# Patient Record
Sex: Male | Born: 1980 | Race: White | Hispanic: No | Marital: Married
Health system: Midwestern US, Academic
[De-identification: ages and names within clinical notes are randomized; demographics above are authoritative.]

---

## 2016-11-26 ENCOUNTER — Encounter: Admit: 2016-11-26 | Discharge: 2016-11-27

## 2016-11-26 DIAGNOSIS — R69 Illness, unspecified: Principal | ICD-10-CM

## 2016-11-27 ENCOUNTER — Encounter: Admit: 2016-11-27 | Discharge: 2016-11-27

## 2016-11-27 ENCOUNTER — Encounter: Admit: 2016-11-27 | Discharge: 2016-11-28

## 2016-11-27 DIAGNOSIS — R69 Illness, unspecified: Principal | ICD-10-CM

## 2016-11-27 NOTE — Progress Notes
36 [place] get [place] liver transplant list.  Admitted 11/22 with AMS became obtunded and intubated.  Ammonia 430 yesterday down to 79 after lactalose.  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  100cc urine in past 20hours.  Some ascites. Does get frequent paracentesis per wife.    Patient is sedated and beginning to overbreathing the vent.

## 2016-11-29 ENCOUNTER — Encounter: Admit: 2016-11-29 | Discharge: 2016-11-29

## 2016-11-30 ENCOUNTER — Encounter: Admit: 2016-11-30 | Discharge: 2016-11-30

## 2016-11-30 DIAGNOSIS — R69 Illness, unspecified: Principal | ICD-10-CM

## 2016-12-09 ENCOUNTER — Encounter: Admit: 2016-12-09 | Discharge: 2016-12-09

## 2016-12-09 NOTE — Telephone Encounter
Received new referral, via fax. Docs scanned in 12/09/16. If patient is unable to answer, call Audery Amelhelsie Farrar, patient's social worker SW.

## 2016-12-11 ENCOUNTER — Encounter: Admit: 2016-12-11 | Discharge: 2016-12-11

## 2016-12-14 ENCOUNTER — Encounter: Admit: 2016-12-14 | Discharge: 2016-12-14

## 2016-12-14 NOTE — Telephone Encounter
Unable to locate active insurance cannot give financial clearance for transplant services at this time. Referral has been denied due to patient not having active insurance.

## 2016-12-14 NOTE — Telephone Encounter
Spoke with pt's wife, Lurena JoinerRebecca.  She states [facility] is [facility] and she expects to hear of approval this week.  She would like to go ahead and schedule new consult appt and understands that she may be required to pay $151 up front at the time of visit if there is no active coverage at the time pt is seen.

## 2016-12-18 ENCOUNTER — Encounter: Admit: 2016-12-18 | Discharge: 2016-12-18

## 2016-12-18 NOTE — Telephone Encounter
Called patient to schedule new appointment with Dr. Constance Goltzlson on Tuesday, January 12, 2017 at 11:20am.   Verified demos and mailed appointment letter/map.   Pt stated they are currently being approved for Medicaid. They have discussed already with financial.

## 2016-12-22 ENCOUNTER — Encounter: Admit: 2016-12-22 | Discharge: 2016-12-22

## 2016-12-30 ENCOUNTER — Encounter: Admit: 2016-12-30 | Discharge: 2016-12-30

## 2016-12-30 ENCOUNTER — Inpatient Hospital Stay
Admit: 2016-12-30 | Discharge: 2017-01-01 | Disposition: A | Discharge status: Home / Self Care | Payer: MEDICAID | Source: Other Acute Inpatient Hospital

## 2016-12-30 DIAGNOSIS — D649 Anemia, unspecified: ICD-10-CM

## 2016-12-30 DIAGNOSIS — K7290 Hepatic failure, unspecified without coma: ICD-10-CM

## 2016-12-30 DIAGNOSIS — D696 Thrombocytopenia, unspecified: ICD-10-CM

## 2016-12-30 DIAGNOSIS — E872 Acidosis: ICD-10-CM

## 2016-12-30 DIAGNOSIS — N179 Acute kidney failure, unspecified: Secondary | ICD-10-CM

## 2016-12-30 DIAGNOSIS — K7460 Unspecified cirrhosis of liver: Principal | ICD-10-CM

## 2016-12-30 DIAGNOSIS — I8510 Secondary esophageal varices without bleeding: ICD-10-CM

## 2016-12-30 DIAGNOSIS — R69 Illness, unspecified: Principal | ICD-10-CM

## 2016-12-30 LAB — COMPREHENSIVE METABOLIC PANEL
Lab: 11 U/L (ref 7–56)
Lab: 138 MMOL/L — ABNORMAL LOW (ref 137–147)
Lab: 16 MMOL/L — ABNORMAL LOW (ref 21–30)
Lab: 17 K/UL — ABNORMAL HIGH (ref 3–12)
Lab: 192 mg/dL — ABNORMAL HIGH (ref 70–100)
Lab: 2.7 mg/dL — ABNORMAL HIGH (ref 0.4–1.24)
Lab: 26 mL/min — ABNORMAL LOW (ref >60)
Lab: 32 mL/min — ABNORMAL LOW (ref >60)
Lab: 35 U/L (ref 7–40)
Lab: 4.1 mg/dL — ABNORMAL HIGH (ref 0.3–1.2)
Lab: 4.3 g/dL (ref 3.5–5.0)
Lab: 49 U/L — ABNORMAL LOW (ref 25–110)
Lab: 5.5 g/dL — ABNORMAL LOW (ref 6.0–8.0)
Lab: 54 mg/dL — ABNORMAL HIGH (ref 7–25)
Lab: 9.3 mg/dL — ABNORMAL HIGH (ref 8.5–10.6)

## 2016-12-30 LAB — CBC AND DIFF
Lab: 0.1 10*3/uL (ref 0–0.20)
Lab: 0.5 10*3/uL — ABNORMAL HIGH (ref 0–0.45)
Lab: 1 % (ref 0–2)
Lab: 10 10*3/uL (ref 4.5–11.0)
Lab: 10 FL (ref 7–11)
Lab: 133 10*3/uL — ABNORMAL LOW (ref 150–400)
Lab: 2.5 10*3/uL (ref 1.0–4.8)
Lab: 20 % — ABNORMAL HIGH (ref 11–15)
Lab: 25 % (ref 24–44)
Lab: 3.2 M/UL — ABNORMAL LOW (ref 4.4–5.5)
Lab: 30 pg (ref 26–34)
Lab: 34 g/dL (ref 32.0–36.0)
Lab: 5 % (ref 0–5)
Lab: 6.2 10*3/uL (ref 1.8–7.0)
Lab: 61 % (ref 41–77)
Lab: 8 % (ref 4–12)
Lab: 90 FL (ref 80–100)
Lab: 0.2 10*3/uL (ref 0–0.20)
Lab: 0.4 10*3/uL (ref 0–0.45)
Lab: 9 10*3/uL (ref 4.5–11.0)

## 2016-12-30 LAB — PERITONEAL FLUID ALBUMIN

## 2016-12-30 LAB — LACTIC ACID(LACTATE)
Lab: 5.1 MMOL/L — ABNORMAL HIGH (ref 0.5–2.0)
Lab: 3.6 MMOL/L — ABNORMAL HIGH (ref 0.5–2.0)

## 2016-12-30 LAB — URINALYSIS DIPSTICK
Lab: NEGATIVE
Lab: NEGATIVE
Lab: NEGATIVE
Lab: NEGATIVE
Lab: NEGATIVE
Lab: NEGATIVE
Lab: NEGATIVE

## 2016-12-30 LAB — HEPATITIS PANEL, ACUTE
Lab: NEGATIVE
Lab: NEGATIVE

## 2016-12-30 LAB — SODIUM-URINE RANDOM: Lab: 16 MMOL/L (ref 0–3)

## 2016-12-30 LAB — PERITONEAL FLUID TOTAL PROTEIN: Lab: 1.6 g/dL

## 2016-12-30 LAB — ALCOHOL LEVEL: Lab: <10 mg/dL

## 2016-12-30 LAB — PROTIME INR (PT)
Lab: 1.8 g/dL — ABNORMAL HIGH (ref 0.8–1.2)
Lab: 1.9 — ABNORMAL HIGH (ref 0.8–1.2)
Lab: 2.2 — ABNORMAL HIGH (ref 0.8–1.2)

## 2016-12-30 LAB — CREATININE-URINE RANDOM: Lab: 58 mg/dL

## 2016-12-30 LAB — URINALYSIS, MICROSCOPIC

## 2016-12-30 LAB — ACETAMINOPHEN LEVEL: Lab: <10 ug/mL (ref <20.1)

## 2016-12-30 LAB — MAGNESIUM
Lab: 1.7 mg/dL — ABNORMAL LOW (ref 1.6–2.6)
Lab: 2 mg/dL — ABNORMAL LOW (ref 1.6–2.6)

## 2016-12-30 LAB — PHOSPHORUS: Lab: 4.1 mg/dL — ABNORMAL LOW (ref 2.0–4.5)

## 2016-12-30 LAB — PTT (APTT): Lab: 35 s — ABNORMAL LOW (ref 24.0–36.5)

## 2016-12-30 LAB — BASIC METABOLIC PANEL: Lab: 142 MMOL/L (ref 137–147)

## 2016-12-30 LAB — UREA NITROGEN-URINE RANDOM: Lab: 448 mg/dL (ref 5.0–8.0)

## 2016-12-30 LAB — HIV 1& 2 AG-AB SCRN W REFLEX HIV 1 PCR QUANT: Lab: NEGATIVE

## 2016-12-30 LAB — POC GLUCOSE: Lab: 156 mg/dL — ABNORMAL HIGH (ref 70–100)

## 2016-12-30 MED ORDER — LACTULOSE 10 GRAM/15 ML PO SOLN
30 mL | Freq: Three times a day (TID) | ORAL | 0 refills | Status: DC
Start: 2016-12-30 — End: 2016-12-30
  Administered 2016-12-30: 15:00:00 20 g via ORAL

## 2016-12-30 MED ORDER — HEPARIN, PORCINE (PF) 5,000 UNIT/0.5 ML IJ SYRG
5000 [IU] | SUBCUTANEOUS | 0 refills | Status: DC
Start: 2016-12-30 — End: 2017-01-02
  Administered 2016-12-30 – 2017-01-01 (×6): 5000 [IU] via SUBCUTANEOUS

## 2016-12-30 MED ORDER — ALBUMIN, HUMAN 25 % IV SOLP
0 refills | Status: CP
Start: 2016-12-30 — End: ?
  Administered 2016-12-30 (×4): 12.5 g via INTRAVENOUS

## 2016-12-30 MED ORDER — POTASSIUM CHLORIDE 20 MEQ/15 ML PO LIQD
60 meq | Freq: Once | ORAL | 0 refills | Status: CP
Start: 2016-12-30 — End: ?
  Administered 2016-12-31: 60 meq via ORAL

## 2016-12-30 MED ORDER — POTASSIUM CHLORIDE IN WATER 10 MEQ/50 ML IV PGBK
10 meq | INTRAVENOUS | 0 refills | Status: CP
Start: 2016-12-30 — End: ?
  Administered 2016-12-30 (×4): 10 meq via INTRAVENOUS

## 2016-12-30 MED ORDER — MAGNESIUM SULFATE IN D5W 1 GRAM/100 ML IV PGBK
1 g | INTRAVENOUS | 0 refills | Status: CP
Start: 2016-12-30 — End: ?
  Administered 2016-12-30 (×2): 1 g via INTRAVENOUS

## 2016-12-30 MED ORDER — CEFTRIAXONE INJ 1GM IVP
1 g | INTRAVENOUS | 0 refills | Status: DC
Start: 2016-12-30 — End: 2016-12-31
  Administered 2016-12-30: 23:00:00 1 g via INTRAVENOUS

## 2016-12-30 MED ORDER — POTASSIUM CHLORIDE 20 MEQ PO TBTQ
40-60 meq | ORAL | 0 refills | Status: DC | PRN
Start: 2016-12-30 — End: 2017-01-02
  Administered 2017-01-01: 18:00:00 40 meq via ORAL

## 2016-12-30 MED ORDER — ALBUMIN, HUMAN 25 % IV SOLP
50 g | Freq: Once | INTRAVENOUS | 0 refills | Status: CP
Start: 2016-12-30 — End: ?
  Administered 2016-12-30: 15:00:00 50 g via INTRAVENOUS

## 2016-12-30 MED ORDER — SODIUM CHLORIDE 0.9 % IV SOLP
1000 mL | Freq: Once | INTRAVENOUS | 0 refills | Status: CP
Start: 2016-12-30 — End: ?
  Administered 2016-12-30: 17:00:00 1000 mL via INTRAVENOUS

## 2016-12-30 MED ORDER — POTASSIUM CHLORIDE 20 MEQ/15 ML PO LIQD
60 meq | Freq: Once | ORAL | 0 refills | Status: CP
Start: 2016-12-30 — End: ?
  Administered 2016-12-30: 16:00:00 60 meq via ORAL

## 2016-12-30 MED ORDER — OCTREOTIDE ACETATE 200 MCG/ML IJ SOLN
50 ug | SUBCUTANEOUS | 0 refills | Status: DC
Start: 2016-12-30 — End: 2016-12-30

## 2016-12-30 MED ORDER — ZINC SULFATE 220 (50) MG PO CAP
220 mg | Freq: Every day | ORAL | 0 refills | Status: DC
Start: 2016-12-30 — End: 2017-01-02
  Administered 2016-12-30 – 2017-01-01 (×3): 220 mg via ORAL

## 2016-12-30 MED ORDER — ALBUMIN, HUMAN 5 % IV SOLP
500 mL | Freq: Once | INTRAVENOUS | 0 refills | Status: CP
Start: 2016-12-30 — End: ?
  Administered 2016-12-31: 06:00:00 500 mL via INTRAVENOUS

## 2016-12-30 MED ORDER — ONDANSETRON HCL (PF) 4 MG/2 ML IJ SOLN
4 mg | INTRAVENOUS | 0 refills | Status: DC | PRN
Start: 2016-12-30 — End: 2017-01-02
  Administered 2017-01-01: 18:00:00 4 mg via INTRAVENOUS

## 2016-12-30 MED ORDER — MIDODRINE 5 MG PO TAB
5 mg | Freq: Three times a day (TID) | ORAL | 0 refills | Status: DC
Start: 2016-12-30 — End: 2016-12-30

## 2016-12-30 MED ORDER — MAGNESIUM HYDROXIDE 2,400 MG/10 ML PO SUSP
10 mL | ORAL | 0 refills | Status: DC | PRN
Start: 2016-12-30 — End: 2017-01-02

## 2016-12-30 MED ORDER — POTASSIUM CHLORIDE 20 MEQ/15 ML PO LIQD
40-60 meq | NASOGASTRIC | 0 refills | Status: DC | PRN
Start: 2016-12-30 — End: 2017-01-02

## 2016-12-30 MED ORDER — ACETAMINOPHEN 325 MG PO TAB
325 mg | ORAL | 0 refills | Status: DC | PRN
Start: 2016-12-30 — End: 2017-01-02

## 2016-12-30 MED ORDER — LACTULOSE 10 GRAM/15 ML PO SOLN
30 mL | ORAL | 0 refills | Status: DC
Start: 2016-12-30 — End: 2016-12-31
  Administered 2016-12-30 – 2016-12-31 (×10): 20 g via ORAL

## 2016-12-30 MED ORDER — RIFAXIMIN 550 MG PO TAB
550 mg | Freq: Two times a day (BID) | ORAL | 0 refills | Status: DC
Start: 2016-12-30 — End: 2017-01-02
  Administered 2016-12-30 – 2017-01-01 (×5): 550 mg via ORAL

## 2016-12-30 MED ORDER — FUROSEMIDE 40 MG PO TAB
40 mg | Freq: Every day | ORAL | 0 refills | Status: DC
Start: 2016-12-30 — End: 2016-12-30

## 2016-12-30 MED ORDER — MELATONIN 3 MG PO TAB
3 mg | Freq: Every evening | ORAL | 0 refills | Status: DC | PRN
Start: 2016-12-30 — End: 2016-12-30

## 2016-12-30 MED ORDER — INSULIN ASPART 100 UNIT/ML SC FLEXPEN
0-7 [IU] | Freq: Every day | SUBCUTANEOUS | 0 refills | Status: DC
Start: 2016-12-30 — End: 2016-12-31
  Administered 2016-12-30: 22:00:00 1 [IU] via SUBCUTANEOUS

## 2016-12-30 MED ORDER — OCTREOTIDE ACETATE 200 MCG/ML IJ SOLN
100 ug | Freq: Three times a day (TID) | SUBCUTANEOUS | 0 refills | Status: DC
Start: 2016-12-30 — End: 2017-01-02
  Administered 2016-12-30 – 2017-01-01 (×7): 100 ug via SUBCUTANEOUS

## 2016-12-30 MED ORDER — MAGNESIUM SULFATE IN D5W 1 GRAM/100 ML IV PGBK
1 g | INTRAVENOUS | 0 refills | Status: DC | PRN
Start: 2016-12-30 — End: 2017-01-02
  Administered 2017-01-01 (×2): 1 g via INTRAVENOUS

## 2016-12-30 NOTE — Case Management (ED)
Case Management Progress Note    NAME:Tristan Briggs                          MRN: [id number]              DOB:[birth year]          AGE: 36 y.o.  ADMISSION DATE: 12/30/2016             DAYS ADMITTED: LOS: 0 days      Todays Date: 12/30/2016    Plan  Discharge planning ongoing.    Interventions  ? Support      ? Info or Referral      ? Discharge Planning      ? Medication Needs      ? Financial    Per Jolene SchimkeAnna Steinnagel, patient financial advisor:  "I spoke with PTs wife Lurena JoinerRebecca (she was extremely helpful). I was able to get them screened. I also went up and got the ROI signed by the PT, wife provided me SSDI approval letter as well. Im faxing [facility] as we speak and will also forward you the forms as well. PTs wife believes it was about 5-7 days ago when [facility] were at [facility] [place] that they need to supply bank statements for application, and that was done per PTs wife. I will have to give the Medicaid office roughly 24 hours from the time I fax the form for them to receive it. I will follow up later on tomorrow and see if I can get any update on application status."      ? Legal      ? Other        Disposition  ? Expected Discharge Date    Expected Discharge Date: 01/02/17  ? Transportation   Does the patient need discharge transport arranged?: No  Transportation Name, Phone and Availability #1: [phone]  Does the patient use Medicaid Transportation?: No  ? Next Level of Care (Acute Psych discharges only)      ? Discharge Disposition                                          Durable Medical Equipment      No service has been selected for the patient.      [facility]      No service has been selected for the patient.      [facility]      No service has been selected for the patient.      [facility]      No service has been selected for the patient.          Cleophas Dunkeranielle Candiss Galeana, RN, MSN  Nurse Case Manager  [phone]

## 2016-12-30 NOTE — Progress Notes
Paracentesis   1. Vital signs q 15 minutes x 2 then may resume prior order.  2. Bed rest with bathroom privileges x 1 hour then advance activity as tolerated.  3. Change dressing if it becomes wet or soiled. Remove 24 hours after procedure.  4. Implement Albumin protocol.   Determine the volume of fluid removed from the patient   1. ?5 Liters of ascetic fluid - normal renal function  hemodynamically stable vital signs  No albumin resuscitation  2. < 5 Liters of ascetic fluid - renal insufficiency (SCr > 1/3 mg/dl or a GFR < 60ml/min)   1. < 1.5L No albumin replacement  2. < 1.6-2.0 12.5gm (50ml) Albumin 25%  3. < 2.1-3.9L 25gm (100ml) Albumin 25%  4. < 4.0-5.0L 37.5gm (150ml) Albumin 25%  3. > 5 Liters of ascetic fluid  37.5gm (150ml) of Albumin 25% then 50ml additional  Albumin 25% for every 2.0L of fluid removed

## 2016-12-30 NOTE — Progress Notes
SOCIAL WORK PSYCHOSOCIAL ASSESSMENT      Name:  Tristan Briggs #: [id number]     Date: 12/30/2016     Referral Source: Dr. Constance Goltz                                             Date Referred:  12/30/16  Referral Reason:OLT Psychosocial Assessment  Source of Information: Pt/s wife, Lurena Joiner, and pt         Date Interviewed:  12/30/16&12/31/16  Patient's Address:  [address]  [place] [address]                                     Telephone #:  [phone] (home)                                                                                1.  PRESENT SITUATION:                Sex: male          Age: 36 y.o.          Birthdate: [birth year]  Primary Language: Lenox Ponds   Religous Preference:Christian    Diagnosis:   ESLD, AKI  Statement of Presenting Problem(s):  Ps is a 36 yr old male who was transferred from OSH for management of his ESLD and AKI. Pt states he was diagnosed with liver disease a little while after he quit drinking, 4/18. Pt states he was having symptoms of liver disease before he got sick. 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 He states he quit drinking, 04/11/16 because he knew he needed to quit. Lurena Joiner states that pt wanted to quit drinking in March 2018 and went to the ED to detox. She states he was given medication to help detox at home and was discharged.  She states pt had vomiting and nausea, trouble sleeping and was having bad hallucinations. Pt went back to drinking but not as much.  She states pt went back to the ED in April and was given more medication to detox at home and then discharged.  She states pt had some of the same issues but not as bad and has remained sober since.  Pt states he was never really sick in the past. He states he was a little overweight.  He did not have a PCP and did not go to the doctor unless he was sick.  Pt denies taking OTC medication. He states he would not even take Asprin for pain.  Pt's wife has been keeping track of pt's appointments and making sure pt attends as scheduled.  Pt states he stopped driving, 5/62, because he was too scared to drive with his medical condition. Pt states he has had multiple ED visits and hospitalizations since diagnosis. He states he would go the ED for paracentesis in [place].  Lurena Joiner states hospitalizations were for nausea, vomiting, HE, and paracentesis.  He [facility] ED at  [facility] [address] in [place].  Pt saw a GI doctor at [facility] who told pt to come back in six months.  Lurena Joiner lost her job caring for pt and then pt got on MediCal. He was then admitted to [facility]. Lurena Joiner researched transplant programs and came to [facility]. Pt has been hospitalized 3 times at [facility] in [place] since arriving, 11/23/16.        2.  PATIENT'S LIVING SITUATION    Comments:  Pt was born and raised in [place].  Pt, his wife, and two children were living with his father in law, in a home that he owned.  Pt moved to [place], 11/23/16.  Pt's wife came to [place], 10/18.  She states she did her research on transplant and decided to come to [place].  She states she has a friend who lives in [place], [place] (10-15 minutes from [place], [place]).  She states after driving to [place] she drove to [place], [place] to look at [facility] program.  She states she did not like it there and came back to [facility]. Pt states all of his stuff is in a storage unit in [place].  She states they, pt, Lurena Joiner, and two children, have been staying with her friend until, 12/24/16.  She states that is when the moved into the duplex they are renting in [place], [place]. There are about 8 stairs to enter the home. There is only one bathroom in the home and it is on the upper level. Pt has been staying on the main level of the home.  Pt has one dog for a pet. Pt had HH for IV Antibiotics when he was living in [place].  Pt has not stayed in a SNF, LTACH or IRF in the pat.  Pt is responsible for his own medication management. Lurena Joiner states pt has alarms on his phone set to remind him to take medications. Pt confirms that he has reminders set on his phone and states his wife helps with medication management when she is not working.  Pt states he used to miss medications but realized that his ammonia would get too high after missing just one dose of lactulose and denies missing medications currently.   Pt states he is starting over in [place] and has no plans to move back to [place]. He states Rebecca's oldest son, is planning to move to  to help pt.  Pt states his sister is also considering moving to .       3.  FINANCIAL RESOURCES    Comments:  Pt was unable to report when he last worked. He states he isn't good with dates.  Lurena Joiner reported pt last worked, 11/17.  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  He states he worked there for a year and a half. He states prior to that he worked at [employer] doing lost prevention.  He states he worked at [employer] for about a year.  Pt states he stopped working because he was getting sick all of the time. He states alcohol was also a factor in keeping a job. He states he was drinking before coming to work and drinking right after work.  Lurena Joiner states they applied for SS disability, 6/18 and was recently approved.  She states pt should receive his first full check next month.  She states he will be receiving $723/mo gross. She states pt has child support and taxes taken  out of his check.   Lurena Joiner transferred her job in [place] to a company in [place].  She has only been working for a couple of weeks.  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  She is unsure what her schedule will be like once off training. Lurena Joiner states she makes $15/hr and usually only works 40 hours/wk.  She states she receives $28/mo in Child support for her two older children.  She denies there is any other income in the home. Pt does not currently have insurance.  Lurena Joiner states pt had insurance through [place] but she lost her job because she was providing care for pt.  Pt then had MediCal.  Lurena Joiner states they applied for [place] Medicaid through the [facility], 11/18.  She states someone at [facility] is trying to get the application expedited.  She states Tobi Bastos, Artist, [employer], is working on finding out status of application.  Pt has been getting medication through [facility] in [place].  Lurena Joiner states [address] Thelma Barge has been giving them vouchers for medication.  She also reports pt brought some medication from [place].  Lurena Joiner states she receives $468/mo in [facility] assistance.  She states this will decrease because she is working now.  Pt had not met with TFA at time of SW assessment.  Pt states when he and his wife are not working they have financial difficulties. He states that he lets his wife deal with all of the finances.      4.  SUPPORTIVE RELATIONSHIPS/ COMMUNITY RESOURCES     Comments:  Pt has been in a relationship with Lurena Joiner for 17 years. Pt states they have been legally married for one year.  Lurena Joiner states they have been married for 5 years.  Pt has 3 biological children (youngest two are with Lurena Joiner) and two step children that he considers his own; Harrold Donath, 18, never had contact, Virgil, 14 and Baldwin, 11.  Lurena Joiner has 4 children; Oglesby, 21, and Ty[place]in, [place].  She states all of the older children live in [place].  Pt states Erie Noe is pregnant and due, August 2019.  Pt's mother is living and in good health. She lives in [place] with pt's step dad, [employer].  Pt states his mother provides care for his half sister who is autistic.  Pt states his mother will not be part of his post transplant support.  Pt's father is deceased. Pt states his father died when he was young of a heroin overdose.  Pt states he doesn't like talking about it.  Pt has a half brother and two half sisters. Pt's brother is in [employer] and lives in [place].  Pt states his brother plans to come to visit pt, 01/10/17.  Pt reports good support from his sister, [place].  Monica lives in [place] and pt states she will take time off work and come to [employer] and assist with pt's care.  Lurena Joiner states she will be pt's primary caregiver.  She states she will provide care post transplant.  She states if she is unable to provide care, her friend, Annice Pih, will provide care.  She states Annice Pih has been assisting with care currently.  Annice Pih is currently not working.  Lurena Joiner will provide transportation to the hospital once pt receives transplant call.       Caregiver Support  1.Rebecca, spouse, [phone]  2.Clarisse Gouge, friend, [phone] (not working)  3. Flint Melter, sister, [phone]    5. COGNITIVE ABILITIES    Comments:  Pt was alert and oriented during assessment. Pt has a general understanding of his medical condition and of the transplant process.  SW discussed risks and benefits of transplant. Pt denies having concerns regarding receiving a transplant.  Pt states he has too much to live for. Pt hopes he will be able to go back to work post transplant. He states he hopes to have a sober life with his children.  Pt was informed of the OLT Support group and [facility] Program.    Pt is aware he will be required to take life long medication post transplant. Pt is aware of the frequent f/u at [facility] post transplant. Pt is aware he will be required to have 24 hr care (for at least two weeks) post transplant discharge.  SW will complete Patient Responsibilities Form with pt and spouse once pt is evaluated for transplant.      Pt states he used to occasionally smoke in the past.  He states he would smoke a pack a week. Pt states he last smoking over a year ago.  Pt states he just decided to quit. Pt denies using chewing tobacco.  Pt is aware he cannot use tobacco in any form.      Pt states because of his dad he never really did drugs and was too scared. He states th has used marijuana occasionally. He states he last used marijuana before he came to [place], 11/18. He states prior to that it was around Halloween. Pt states he has used CBD oil but last used prior to his wife leaving to come to , 10/18.  Pt denies attending treatment for drug use in the past. Pt is aware he cannot use illicit drugs in any form.      Pt states he started drinking when he was young, around age [age]. Pt states he drank beer at that time. He states he got in to partying early. He states by the age 26 he would drink at home. He states he would drink 6-12 beers per night at that time. Pt states over time the amount he drank increased. He states his drink of choice was Whiskey.  He states at the heaviest he would drink a fifth a day plus beer or limeritas.  Pt states he would drink 7 days a week. Pt states he would have to drink to feel normal, function. Pt states he would search the house for money to buy alcohol and would get whatever he could afford.  He states he last drank, 04/11/16. He states he quit because he was sick of the constant vomiting and no sleep for days. Pt denies ever having a DUI, or current legal issues. Pt states he was on probation when he was [age] for an alcohol related incident. He states he states he was charged with trespassing and attempted grand theft.  Pt denies ever attending treatment for alcohol. Pt states he attended one AA meeting in [place]. Pt states the doctor that gave him paracentesis suggested pt attend. Pt states he would argue with his wife about alcohol every once in awhile but he states she would often be drinking with him.  Pt states she quit drinking when he did, 04/11/16.  Pt states his mother in law died of alcoholism. He states his sister used to drink and stopped  when he was living with her because of what pt is going through.  Pt states he has no desire to drink.  Sw discussed importance in enrolling in treatment and recommended pt contact [facility].  SW provided counseling checklist and list of local counseling resources, including [facility]'s contact information.      Pt denies history of SI/SA, psychiatric hospitalizations or MH diagnosis.  Pt states he did have AH,VH, TH after he quit drinking. He denies having any form of hallucinations currently.      Pt completed 11th grade. He states he has plans to get his GED. Pt denies ever being in the [employer]. Pt enjoys writing, drawing, art, building models, horror movies, hiking and camping.  Pt does not have an AD/DPOA-HC and was educated on the importance of this document.      6.  SOCIAL WORK EVALUATION    Comments:  SW met with pt and his wife to complete OLT Psychosocial Assessment. Pt receives SS disability and is currently uninsured. He has applied for [facility] and is awaiting a decision. Pt's wife is his primary support and she states she will provide 24 hr care for pt post transplant. Rebecca's friend and pt's sister will assist with care and transportation if needed. Pt will likely benefit from ongoing education on transplant.  Pt denies recent tobacco use. Pt last use marijuana 11/18. Pt last drank, 4/18.  SW recommends pt engage in treatment on discharge.  Pt's case discussed with Hepatology Team.      7.  PLAN    Plan Discussed with Patient / Family:  {PLAN DISCUSSED, AGREED UPON  Plan Agreed Upon with Patient / Family:  {PLAN DISCUSSED, AGREED UPON    Nicanor Bake, LMSW

## 2016-12-30 NOTE — Discharge Instructions - Appointments
Please contact the clinic and schedule a follow up appointment.    [facility]   5.0 (5)  Medical clinic   [address]   [phone]       Visitor Information  What to Bring  Photo ID   Proof of Residency: A bill in your name sent to your current address, a bank statement in your name sent to your current address   Proof of income (if uninsured and interested in sliding-scale fee) OR insurance card   If uninsured, please bring one of these: most recent tax return or two most recent pay stubs for each adult in your household.

## 2016-12-30 NOTE — Progress Notes
Plan: meet with pt to complete OLT Psychosocial Assessment    Intervention:  OLT SW requested to meet with pt to complete [facility].  Sw reviewed EMR.  SW attempted to meet with pt but pt off unit to IR.  SW did meet with pt's wife, Lurena JoinerRebecca and completed part of assessment.  SW agreed to come back tomorrow morning and meet with pt.  Lurena JoinerRebecca states she has to work in the morning and will not be here until after work.  SW verbalized understanding.  SW provided her business cared for future questions/concerns.  Nicanor BakeKellie Jamien Casanova, LMSW

## 2016-12-30 NOTE — Progress Notes
Patient arrived to room # [id number]([id number]) via cart accompanied by EMS. Patient transferred to the bed with assistance. Bedside safety checks completed. Initial patient assessment completed, refer to flowsheet for details. Admission skin assessment completed by:     Pressure Injury Present on Hospital Admission (within 24 hours): Yes    1. Occiput: No  2. Ear: No  3. Scapula: No  4. Spinous Process: No  5. Shoulder: No  6. Elbow: No  7. Iliac Crest: No  8. Sacrum/Coccyx: Yes  9. Ischial Tuberosity: No  10. Trochanter: No  11. Knee: No  12. Malleolus: No  13. Heel: No  14. Toes: No  15. Assessed for device associated injury Yes  16. Nursing Nutrition Assessment Completed No    See Doc Flowsheet for additional wound details.     INTERVENTIONS:

## 2016-12-30 NOTE — Progress Notes
Recently moved from [place], to be close to [employer] [facility]    D/C from [place] 1w ago--> comps from liver failure-had a paracentesis for ascites  Planned Appt w/ Hepatology in January     Sudden onset-->4-5h persistant vomiting (bile in color)    Jaundiced-skin/sclera  Abdomen Distended-soft/non tender    WBC-8.9  Hgb/HCT-10.4/30.1  Plt-126  INR-1.9  Na+137  K+2.6-->Replacement now  Cl-104  CO2-16  Lactate-3.5-->1L NS--> Asked to obtain re-check after 1L IVF to determine trend (ICU vs IM)  Bili-5.4  BUN-58  Creat-2.8  Amonia-222 (50 on d/c from [facility] per wife) lactulose/octreotide daily-states has been taking as ordered  AST-38  ALT-9  Alk Phos-68      Lactulose-->Attempting to admin, but "dry heaving" may need to do enema  Zofran/Phenergran    CXR (-)  Plain Abdomen Film-non obstructive bowel/gas pattern    VS:  HR-100  BP-146/97  RR-16  T-97.9  SpO2-99% RA  MS-A/O x4    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

## 2016-12-30 NOTE — Case Management (ED)
Case Management Admission Assessment    NAME:Tristan Briggs                          MRN: [id number]             DOB:Mar 24, 1980          AGE: 36 y.o.  ADMISSION DATE: 12/30/2016             DAYS ADMITTED: LOS: 0 days      Today???s Date: 12/30/2016    Source of Information: Patient in bed however unable to answer questions.  This NCM introduced self and explained the role to his wife Lurena Joiner. Lurena Joiner answered all questions during interview.    Plan  Plan: Assist PRN with SW/NCM Services, Discharge Planning for Home Anticipated   ??? Patient and wife just moved from [place] in November.  ??? Patient lives with wife in a 2 story house.  ??? Wife states patient is weak and has a hard time getting in and out of the shower.    ??? Patient utilizes a WC at home.  ??? Patient does not have insurance at this time.  Wife states they applied for [facility] as well as [place] Medicaid.  ??? This NCM will e-mail the hospital financial counselors.  ??? Patient does not have a PCP at this time.  This NCM tasked CMA for safety clinic information for patient.  ??? This NCM will continue to follow.    Patient Address/Phone  [address]  [place] [address]  [phone] (home)     Emergency Contact  Extended Emergency Contact Information  Primary Emergency Contact: Irma Newness  Mobile Phone: [phone]  Relation: Spouse  Preferred language: ENGLISH  Interpreter needed? No    Forensic scientist: No, patient does not have a healthcare directive  Would patient like to fill out a (a new) Healthcare Directive?: No, patient declined      Transportation  Does the patient need discharge transport arranged?: No  Transportation Name, Phone and Availability #1: [phone]  Does the patient use Medicaid Transportation?: No    Expected Discharge Date  Expected Discharge Date: 01/02/17    Living Situation Prior to Admission  ? Living Arrangements  Type of Residence: Home, dependent on others Living Arrangements: Spouse/significant other  Financial risk analyst / Tub: Tub/Shower Unit  How many levels in the residence?: 2  Can patient live on one level if needed?: Yes  Does residence have entry and/or side stairs?: Yes(6 stairs with railing)  Assistance needed prior to admit or anticipated on discharge: Yes  Who provides assistance or could if needed?: Spouse-Rebecca  Are they in good health?: Unknown  Can support system provide 24/7 care if needed?: Yes  ? Level of Function   Prior level of function: Needs assist with ADLs  Which ADLs require assistance?: showering, meal prep  Who assists with ADLs?: Spouse  ? Cognitive Abilities   Cognitive Abilities: Unable to Assess    Financial Resources  ? Coverage  Primary Insurance: No insurance  Secondary Insurance: No insurance  Additional Coverage: None    ? Source of Income   Source Of Income: SSDI  ? Financial Assistance Needed?  Yes    Psychosocial Needs  ? Mental Health  Mental Health History: No  ? Substance Use History  Substance Use History Screen: Yes  Comment: Quit drinking on April 7th.  ? Other  NO    Current/Previous Services  ? PCP  No Pcp, Na, None, None  ? Pharmacy  No Pharmacies Listed  ? Durable Medical Equipment   Durable Medical Equipment at home: Wheelchair (manual)  ? Home Health  Receiving home health: In the past  Agency name: Unknown-in [place]  ? Hemodialysis or Peritoneal Dialysis  Undergoing hemodialysis or peritoneal dialysis: No  ? Tube/Enteral Feeds  Receive tube/enteral feeds: No  ? Infusion  Receive infusions: No  ? Private Duty  Private duty help used: No  ? Home and Community Based Services  Home and community based services: No  ? [employer] Supply: N/A  ? Hospice  Hospice: No  ? Outpatient Therapy  PT: No  OT: No  SLP: No  ? Skilled Nursing Facility/Nursing Home  SNF: No  NH: No  ? Inpatient Rehab  IPR: No  ? Long-Term Acute Care Hospital  LTACH: No  ? Acute Hospital Stay  Acute Hospital Stay: Yes Was patient's stay within the last 30 days?: Yes  When did patient receive care?: 12/18-12/25  Name of hospital: [facility] [place], RN, MSN  Nurse Case Manager  [phone]

## 2016-12-30 NOTE — Other
Critical result or procedure called (document test and value, and read back):  Lactate: 5.1  Time MD/NP Notified:  0905  MD/NP Name: Dr. Derenda MisMelissa Taylor  MD/NP Response/Orders Given:  Provider to review chart and put in orders.

## 2016-12-30 NOTE — Other
Immediate Post Procedure Note    Date:  12/30/2016                                         Attending Physician:    Dr. Nicholaus CorollaSteven Lemons, MD   Performing Provider:  Lorelee MarketEric Gayland Nicol, APRN-NP    Consent:  Consent obtained from patient.  Risks and benefits discussed with patient.  Time out performed: Consent obtained, correct patient verified, correct procedure verified, correct site verified, patient marked as necessary.  Pre/Post Procedure Diagnosis:  ALC  Indications:  Ascites drainage, testing    Anesthesia: Local 10 mL 2% lidocaine without epinephrine  Procedure(s):  US guided paracentesis   Findings:  Ascites with clear yellow aspirate    Estimated Blood Loss:  None/Negligible  Specimen(s) Removed/Disposition:  Yes, sent to pathology  Complications: None  Patient Tolerated Procedure: Well  Post-Procedure Condition:  stable    Lorelee MarketEric Janel Beane, APRN-NP  Pager [phone]

## 2016-12-30 NOTE — Consults
General Consult Note      Admission Date: 12/30/2016                                                LOS: 0 days    Reason for Consult: Alcoholic cirrhosis admitted with hepatic encephalopathy and acute kidney injury    Consult type: Opinion    Assessment/Plan   36 y.o. male with past medical history of alcoholic cirrhosis complicated with ascites, hepatic encephalopathy presents to the [facility] as a transfer from outside facility for hepatic encephalopathy, acute kidney injury and abdominal distention.  Hepatology consulted for further management.    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    MELD-Na score: 29 at 12/30/2016  8:00 AM  MELD score: 29 at 12/30/2016  8:00 AM  Calculated from:  Serum Creatinine: 2.75 MG/DL at 60/45/4098  1:19 AM  Serum Sodium: 138 MMOL/L (Rounded to 137 MMOL/L) at 12/30/2016  8:00 AM  Total Bilirubin: 4.1 MG/DL at 14/78/2956  2:13 AM  INR(ratio): 1.9 at 12/30/2016  8:00 AM  Age: 36 years    # Hepatic encephalopathy:  -PTA lactulose at home.  Presents with confusion likely secondary to hepatic encephalopathy.    #Acute kidney injury  -History of acute kidney injury secondary to hepatorenal syndrome on December 16, 2016 and was discharged on December 22, 2016 from [facility] with Midodrin 5 mg 3 times daily and octreotide 100 mcg 3 times daily with creatinine of 4.25 at the time of discharge.  No dialysis was done during that time.  -Creatinine on admission 2.8.  Denies any NSAID intake recently.    # Esophageal varices screening: EGD November 2018 at [facility] and we will obtain the results.  Had banding done in June 2018 at [place] due to esophageal varices bleeding.    # Fluid management: Diuretics were stopped recently due to acute kidney injury.  Getting paracentesis every week at outside facility [facility] screening: Ultrasound abdomen pending at this time    Recommendations  -Recommend urine ETG today to confirm sobriety from alcohol  -Regarding acute kidney injury, has history of hepatorenal syndrome recently diagnosed at the outside facility and creatinine improving since the time of discharge from 4.25 to 2.8 on admission.  Recommend continuing Midodrine 5 mg 3 times daily along with octreotide 100 mcg 3 times daily and agree with nephrology consult.  Strict input output chart.  Avoid any nephrotoxic medications.  Patient received 50 g of albumin today.  -Regarding hepatic encephalopathy recommend ruling out infection.  Recommend sending UA, blood culture, urine culture and ascitic fluid analysis to rule out infection.  -Recommend lactulose 20 g every 2 hours until he has bowel movements and titrate to 3-4 bowel movements daily.  If patient not able to take oral lactulose then recommend placing CorPak and starting him on GoLYTELY 4 L.  Continue him on rifaximin 550 mg twice daily along with zinc 220 mg daily.  -Recommend starting patient on diet and patient does not need to be NPO for paracentesis.  -We will also our liver transplant social worker to visit the family today  -We will continue to follow    Patient seen and discussed with Dr. Ethlyn Gallery  GI fellow   Pager [phone]  12/30/2016 1:13 PM             ______________________________________________________________________    History of Present Illness: Gopal Melody is a 36 y.o. male with past medical history of alcoholic cirrhosis complicated with ascites, hepatic encephalopathy presents to the [facility] as a transfer from outside facility for hepatic encephalopathy, acute kidney injury and abdominal distention.  Hepatology consulted for further management.    Patient is confused and history obtained from his wife at the bedside.  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  In February 2018 patient went for delirium tremens due to alcohol withdrawal in [place] and was undergoing alcohol rehab without success.  In March/April 2018 patient was admitted to hospital in [place] with weakness and abdominal distention and was told he has alcoholic cirrhosis.  Since then patient tried going for alcohol rehabilitation/counseling for at least a month but due to change in insurance he could not continue.  He has been deteriorating since that time getting admitted to the hospital every month due to abdominal distention, weakness and could not remain healthy.  He quit working since January 2018.  He moved to [place] during Thanksgiving to remain with his friends permanently.  He never had any DUI in the past or been arrested because of alcohol, does not have any insurance at this point.  He has been admitted at least 3 times in [facility] due to abdominal distention.  Last admission was on December 12 - December 22, 2016 and was noted to have acute kidney injury due to possible hepatorenal syndrome with creatinine as high as 4.5 at the time of discharge.  Patient did not get any dialysis at the time and was sent home on Midodrine and octreotide for hepatorenal syndrome treatment.  After going home patient did well until yesterday when he started getting more confused along with abdominal distention and admitted to [facility] and was transferred to [facility] for further management.  His wife denies any history of previous IV drug abuse, high risk sexual behavior, blood transfusion in the past before 1992.  Recent had EGD in August 2018 at [place] and had banding at that time due to GI bleed.  He also had another EGD in November 2018 at [facility] and his wife reports no banding was done at the time.      Past Medical History:   Diagnosis Date   ??? Cirrhosis [facility]) Past Surgical History:   Procedure Laterality Date   ??? PARACENTESIS       Social History     Socioeconomic History   ??? Marital status: Married     Spouse name: Not on file   ??? Number of children: Not on file   ??? Years of education: Not on file   ??? Highest education level: Not on file   Social Needs   ??? Financial resource strain: Not on file   ??? Food insecurity - worry: Not on file   ??? Food insecurity - inability: Not on file   ??? Transportation needs - medical: Not on file   ??? Transportation needs - non-medical: Not on file   Occupational History   ??? Not on file   Tobacco Use   ??? Smoking status: Never Smoker   Substance and Sexual Activity   ???  Alcohol use: Yes   ??? Drug use: Not on file   ??? Sexual activity: Not on file   Other Topics Concern   ??? Not on file   Social History Narrative   ??? Not on file     Family history reviewed; non-contributory  Allergies:  Patient has no known allergies.    Scheduled Meds:  cefTRIAXone (ROCEPHIN) IVP 1 g 1 g Intravenous Q24H*   [MAR Hold] insulin aspart U-100 (NOVOLOG FLEXPEN) injection PEN 0-7 Units 0-7 Units Subcutaneous 5 X Day   [MAR Hold] lactulose oral solution 20 g 30 mL Oral Q2H   [MAR Hold] magnesium sulfate   1 g/D5W 100 mL IVPB 1 g Intravenous Q1H X 2DO   [MAR Hold] octreotide (SANDOSTATIN) injection 100 mcg 100 mcg Subcutaneous TID   [MAR Hold] potassium chloride in water IVPB 10 mEq 10 mEq Intravenous Q1H X 4DO   rifAXIMin (XIFAXAN) tablet 550 mg 550 mg Oral BID   Continuous Infusions:  PRN and Respiratory Meds:[MAR Hold] acetaminophen Q6H PRN, [MAR Hold] magnesium sulfate PRN (On Call from Rx), [MAR Hold] milk of magnesia (CONC) Q6H PRN, [MAR Hold] ondansetron (ZOFRAN) IV Q6H PRN, [MAR Hold] potassium chloride SR PRN **OR** [MAR Hold] potassium chloride PRN    Review of Systems:  A 14 point review of systems was negative except for: HPI  Vital Signs:  Last Filed in 24 hours Vital Signs:  24 hour Range    BP: 127/95 (12/26 1211)  Temp: 37 ???C (98.6 ???F) (12/26 1211) Pulse: 112 (12/26 1211)  Respirations: 19 PER MINUTE (12/26 1211)  SpO2: 100 % (12/26 1211)  O2 Delivery: None (Room Air) (12/26 1211)  SpO2 Pulse: 112 (12/26 1211)  Height: 188 cm (74) (12/26 0632) BP: (127-141)/(74-95)   Temp:  [36.3 ???C (97.3 ???F)-37 ???C (98.6 ???F)]   Pulse:  [103-119]   Respirations:  [18 PER MINUTE-19 PER MINUTE]   SpO2:  [96 %-100 %]   O2 Delivery: None (Room Air)     Physical Exam:  General appearance: Confused and disoriented  Head: Normocephalic, atraumatic  Eyes: No scleral icterus  Oropharynx: No erythema, ulcers  Neck: supple  Lungs: clear to auscultation bilaterally  Heart: regular rate and rhythm, S1, S2 normal, no murmur, click, rub or gallop  Abdomen: Soft, non-tender, distended, no masses palpable, no organomegaly, BS+ve  Neurologic: No focal deficits, asterixis positive  Skin: no obvious rashes  Musculoskeletal: No obvious joint inflammation  Extremities: No pedal edema noted      Lab/Radiology/Other Diagnostic Tests:  24-hour labs:    Results for orders placed or performed during the hospital encounter of 12/30/16 (from the past 24 hour(s))   CBC AND DIFF    Collection Time: 12/30/16  6:35 AM   Result Value Ref Range    White Blood Cells 10.0 4.5 - 11.0 K/UL    RBC 3.21 (L) 4.4 - 5.5 M/UL    Hemoglobin 9.9 (L) 13.5 - 16.5 GM/DL    Hematocrit 16.1 (L) 40 - 50 %    MCV 90.8 80 - 100 FL    MCH 30.8 26 - 34 PG    MCHC 34.0 32.0 - 36.0 G/DL    RDW 09.6 (H) 11 - 15 %    Platelet Count 133 (L) 150 - 400 K/UL    MPV 10.0 7 - 11 FL    Neutrophils 61 41 - 77 %    Lymphocytes 25 24 - 44 %    Monocytes 8 4 - 12 %  Eosinophils 5 0 - 5 %    Basophils 1 0 - 2 %    Absolute Neutrophil Count 6.20 1.8 - 7.0 K/UL    Absolute Lymph Count 2.50 1.0 - 4.8 K/UL    Absolute Monocyte Count 0.80 0 - 0.80 K/UL    Absolute Eosinophil Count 0.50 (H) 0 - 0.45 K/UL    Absolute Basophil Count 0.10 0 - 0.20 K/UL   PROTIME INR (PT)    Collection Time: 12/30/16  6:35 AM   Result Value Ref Range INR 1.8 (H) 0.8 - 1.2   PTT (APTT)    Collection Time: 12/30/16  6:35 AM   Result Value Ref Range    APTT 35.0 24.0 - 36.5 SEC   CBC AND DIFF    Collection Time: 12/30/16  8:00 AM   Result Value Ref Range    White Blood Cells 9.0 4.5 - 11.0 K/UL    RBC 3.09 (L) 4.4 - 5.5 M/UL    Hemoglobin 9.6 (L) 13.5 - 16.5 GM/DL    Hematocrit 45.4 (L) 40 - 50 %    MCV 89.9 80 - 100 FL    MCH 31.2 26 - 34 PG    MCHC 34.7 32.0 - 36.0 G/DL    RDW 09.8 (H) 11 - 15 %    Platelet Count 136 (L) 150 - 400 K/UL    MPV 9.1 7 - 11 FL    Neutrophils 65 41 - 77 %    Lymphocytes 20 (L) 24 - 44 %    Monocytes 9 4 - 12 %    Eosinophils 4 0 - 5 %    Basophils 2 0 - 2 %    Absolute Neutrophil Count 5.80 1.8 - 7.0 K/UL    Absolute Lymph Count 1.80 1.0 - 4.8 K/UL    Absolute Monocyte Count 0.80 0 - 0.80 K/UL    Absolute Eosinophil Count 0.40 0 - 0.45 K/UL    Absolute Basophil Count 0.20 0 - 0.20 K/UL   COMPREHENSIVE METABOLIC PANEL    Collection Time: 12/30/16  8:00 AM   Result Value Ref Range    Sodium 138 137 - 147 MMOL/L    Potassium 2.8 (L) 3.5 - 5.1 MMOL/L    Chloride 105 98 - 110 MMOL/L    Glucose 192 (H) 70 - 100 MG/DL    Blood Urea Nitrogen 54 (H) 7 - 25 MG/DL    Creatinine 1.19 (H) 0.4 - 1.24 MG/DL    Calcium 9.3 8.5 - 14.7 MG/DL    Total Protein 5.5 (L) 6.0 - 8.0 G/DL    Total Bilirubin 4.1 (H) 0.3 - 1.2 MG/DL    Albumin 4.3 3.5 - 5.0 G/DL    Alk Phosphatase 49 25 - 110 U/L    AST (SGOT) 35 7 - 40 U/L    CO2 16 (L) 21 - 30 MMOL/L    ALT (SGPT) 11 7 - 56 U/L    Anion Gap 17 (H) 3 - 12    eGFR Non African American 26 (L) >60 mL/min    eGFR African American 32 (L) >60 mL/min   MAGNESIUM    Collection Time: 12/30/16  8:00 AM   Result Value Ref Range    Magnesium 1.7 1.6 - 2.6 mg/dL   PHOSPHORUS    Collection Time: 12/30/16  8:00 AM   Result Value Ref Range    Phosphorus 4.1 2.0 - 4.5 MG/DL   PROTIME INR (PT)    Collection Time: 12/30/16  8:00 AM   Result Value Ref Range    INR 1.9 (H) 0.8 - 1.2   LACTIC ACID(LACTATE) Collection Time: 12/30/16  8:00 AM   Result Value Ref Range    Lactic Acid 5.1 (HH) 0.5 - 2.0 MMOL/L   HEPATITIS PANEL, ACUTE    Collection Time: 12/30/16 10:40 AM   Result Value Ref Range    Hepatitis A IgM NEG NEG-NEG    Anti HBc IgM NEG NEG-NEG    HBsAg NEG NEG-NEG    Anti HCV NEG NEG-NEG   HIV-1/2 ANTIGEN/ANTIBODY SCREEN    Collection Time: 12/30/16 10:40 AM   Result Value Ref Range    HIV 1 and 2 AG AB Screen NEG NEG-NEG   ACETAMINOPHEN LEVEL    Collection Time: 12/30/16 10:40 AM   Result Value Ref Range    Acetaminophen <10.0 <20.1 MCG/ML   ALCOHOL LEVEL    Collection Time: 12/30/16 10:40 AM   Result Value Ref Range    Alcohol <10 MG/DL     Pertinent radiology reviewed.    Tonye Becket, MD  Pager

## 2016-12-30 NOTE — H&P (View-Only)
Pre-Procedure History and Physical/Sedation Plan    Procedure Date: 12/30/2016     Planned Procedure(s):  Ultrasound guided paracentesis    Indication:  Ascites  __________________________________________________________________    Chief Complaint:  Ascites    History of Present Illness: Tristan Briggs is a 36 y.o. male with a history significant for Ascites who presents today for procedure.    Patient Active Problem List    Diagnosis Date Noted   ??? Hepatic encephalopathy (HCC) 12/30/2016   ??? Alcoholic cirrhosis of liver with ascites (HCC) 12/30/2016   ??? Hepatorenal syndrome (HCC) 12/30/2016   ??? Hypokalemia 12/30/2016     Past Medical History:   Diagnosis Date   ??? Cirrhosis [facility])       Past Surgical History:   Procedure Laterality Date   ??? PARACENTESIS        No medications prior to admission.     No Known Allergies    Social History:   Social History     Tobacco Use   ??? Smoking status: Never Smoker   Substance Use Topics   ??? Alcohol use: Yes      Family History   Problem Relation Age of Onset   ??? None Reported Mother    ??? None Reported Father         Review of Systems  A comprehensive review of systems was negative.    Previous Personal Anesthetic/Sedation History:  Denies adverse events related to sedation/anesthesia.     Previous Family Anesthetic/Sedation History: Denies adverse events related to sedation/anesthesia.    Physical Exam:  Vital Signs: Last Filed In 24 Hours Vital Signs: 24 Hour Range   BP: 127/95 (12/26 1211)  Temp: 37 ???C (98.6 ???F) (12/26 1211)  Pulse: 112 (12/26 1211)  Respirations: 19 PER MINUTE (12/26 1211)  SpO2: 100 % (12/26 1211)  O2 Delivery: None (Room Air) (12/26 1211)  SpO2 Pulse: 112 (12/26 1211)  Height: 188 cm (74) (12/26 0632) BP: (127-141)/(74-95)   Temp:  [36.3 ???C (97.3 ???F)-37 ???C (98.6 ???F)]   Pulse:  [103-119]   Respirations:  [18 PER MINUTE-19 PER MINUTE]   SpO2:  [96 %-100 %]   O2 Delivery: None (Room Air)          General appearance: alert and no distress noted. Neurologic: Grossly normal.  Lungs: Non labored.  Heart: regular rate and rhythm      Airway: airway assessment performed  Mallampati II (soft palate, uvula, fauces visible)  Head and Neck: no abnormalities noted  Mouth: no abnormalities noted  NPO status: Acceptable  Pregnancy Status: Not Pregnant  Anesthesia Classification:  ASA III (A patient with a severe systemic disease that limits activity, but is not incapacitating)  Sedation/Medication Plan: Lidocaine  Discussion/Reviews:  Physician has discussed risks and alternatives of this type of sedation and above planned procedures with patient and significant other    Lab/Radiology/Other Diagnostic Tests:  Labs:  Pertinent labs reviewed           Suzan Slick, APRN  Pager [phone]

## 2016-12-30 NOTE — Consults
Renal Consult    Manoah Ritsema  Admission Date:  12/30/2016         Assessment and Plan     Active Problems:    Hepatic encephalopathy (HCC)    Alcoholic cirrhosis of liver with ascites (HCC)    Hepatorenal syndrome (HCC)    Hypokalemia        Sutton Lessman is a 36 y.o. male    AKI  - multiple recent admissions due to decompensated cirrhosis and requiring frequent large volume paracenteses; most recent admission 12/16/2016 to 12/22/2016 where he had 13 L removed, complicated with hepatorenal syndrome and at discharge his creatinine was 4.25   - Cr. 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 to monitor  - renally dose medications  - strict I/Os  - daily weights; standing if able  - replace potassium   - hold diuretics for now  - urine lytes  - will examine urine microscopy   - if planning for large volume paracentesis, please replace with albumin 6-8 g/L fluid removed  - d/c'ed midodrine given pt's BPs (ordered)  - cont. octreotide for now      pt discussed with Dr. Holley Raring, MD  Nephrology fellow  Pager [phone]        History     Reason for Consult: pt with alc induced cirrhosis comes in with cc of hepatic encephalopathy as a transfer from OSH for hepatology input. please eval and treat    HPI: Tristan Briggs is a 36 y.o. male with PMHx of cirrhosis 2/2 EtOH transferred from OSH on 12/25 for hepatology evaluation. Pt with AMS and thus history obtained for chart review. Reportedly pt presented to OSH with N/V, abd distension and SOB due to abd distension.  Per outside hospital report patient has been admitted 3 times at [facility] at [facility] where he had paracentesis done at least 3 times with each time they have removed more than 5 L ascitic fluid.  Last admission lasted from 12/16/2016 to 12/22/2016 where he had 13 L removed, complicated with hepatorenal syndrome and at discharge his creatinine was 4.25 and was sent home with lactulose, rifaximin, Lasix.     Outside records indicate a diagnosis of hepatorenal syndrome; recently admitted 12/12-12/18/18 with Cr. of 4.25 on discharge. Cr at OSH with current presentation of 2.8     Blood pressures on transfer to [facility] 130-140/70-90.     Pt received 50 g of albumin on 12/26.     Cr. on repeat this AM of 2.75.       Past Medical History   Past Medical History:   Diagnosis Date   ??? Cirrhosis [facility])        Past Surgical History:   Procedure Laterality Date   ??? PARACENTESIS         Family History  Unable to obtain 2/2 pt condition     Social History   Social History     Socioeconomic History   ??? Marital status: Married     Spouse name: Not on file   ??? Number of children: Not on file   ??? Years of education: Not on file   ??? Highest education level: Not on file   Social Needs   ??? Financial resource strain: Not on file   ???  Food insecurity - worry: Not on file   ??? Food insecurity - inability: Not on file   ??? Transportation needs - medical: Not on file   ??? Transportation needs - non-medical: Not on file   Occupational History   ??? Not on file   Tobacco Use   ??? Smoking status: Never Smoker   Substance and Sexual Activity   ??? Alcohol use: Yes   ??? Drug use: Not on file   ??? Sexual activity: Not on file   Other Topics Concern   ??? Not on file   Social History Narrative   ??? Not on file         Medications  MEDS  lactulose 30 mL Oral TID   midodrine 5 mg Oral TID (08-17-15)   octreotide 50 mcg Subcutaneous Q6H   rifAXIMin 550 mg Oral BID    IV MEDS  Prn acetaminophen Q6H PRN, magnesium sulfate PRN (On Call from Rx), melatonin QHS PRN, milk of magnesia (CONC) Q6H PRN, ondansetron (ZOFRAN) IV Q6H PRN, potassium chloride SR PRN **OR** potassium chloride PRN     HOME MEDS  None          Review of Systems  Unable to obtain 2/2 pt condition         Physical Exam        Vital Signs: Last Filed In 24 Hours Vital Signs: 24 Hour Range BP: 137/74 (12/26 0758)  Temp: 36.3 ???C (97.3 ???F) (12/26 0758)  Pulse: 119 (12/26 0758)  Respirations: 18 PER MINUTE (12/26 0758)  SpO2: 96 % (12/26 0758)  O2 Delivery: None (Room Air) (12/26 0758)  Height: 188 cm (74) (12/26 0632) BP: (137-141)/(74-91)   Temp:  [36.3 ???C (97.3 ???F)-36.7 ???C (98.1 ???F)]   Pulse:  [111-119]   Respirations:  [18 PER MINUTE]   SpO2:  [96 %-97 %]   O2 Delivery: None (Room Air)          Vitals:    12/30/16 4540   Weight: 82.4 kg (181 lb 11.2 oz)     No intake or output data in the 24 hours ending 12/30/16 0859     General:  alert; not oriented; follows commands  Head:  Normocephalic, without obvious abnormality, atraumatic  Eyes:  scleral icterus  Throat:  Lips and mucosa normal.   Neck:  Supple  Lungs:  CTAB  Heart:    RRR; S1, S2 normal; No M/R/G  Abdomen:  distended; + fluid wave  Extremities:  Extremities normal, atraumatic, no cyanosis or edema  Skin:   Dry skin; No rashes or lesions  Neurologic: alert; not oriented; follows commands      Labs     Recent Labs      12/30/16   0800   NA  138   K  2.8*   CL  105   CO2  16*   GAP  17*   BUN  54*   CR  2.75*   GLU  192*   CA  9.3   ALBUMIN  4.3   MG  1.7   PO4  4.1       Recent Labs      12/30/16   0635  12/30/16   0800   WBC  10.0  9.0   HGB  9.9*  9.6*   HCT  29.2*  27.7*   PLTCT  133*  136*   INR  1.8*  1.9*   PTT  35.0   --  AST   --   35   ALT   --   11   ALKPHOS   --   49      Estimated Creatinine Clearance: 43.3 mL/min (A) (based on SCr of 2.75 mg/dL (H)).  Vitals:    12/30/16 6045   Weight: 82.4 kg (181 lb 11.2 oz)    No results for input(s): PHART, PO2ART in the last 72 hours.    Invalid input(s): PC02A                        Radiology     Pertinent radiology reviewed.

## 2016-12-31 ENCOUNTER — Encounter: Admit: 2016-12-31 | Discharge: 2016-12-31

## 2016-12-31 LAB — CBC
Lab: 2.9 M/UL — ABNORMAL LOW (ref 4.4–5.5)
Lab: 7.2 K/UL — ABNORMAL HIGH (ref 4.5–11.0)

## 2016-12-31 LAB — POC GLUCOSE
Lab: 192 mg/dL — ABNORMAL HIGH (ref 70–100)
Lab: 134 mg/dL — ABNORMAL HIGH (ref 70–100)
Lab: 128 mg/dL — ABNORMAL HIGH (ref 70–100)
Lab: 151 mg/dL — ABNORMAL HIGH (ref 70–100)
Lab: 172 mg/dL — ABNORMAL HIGH (ref 70–100)

## 2016-12-31 LAB — COMPREHENSIVE METABOLIC PANEL: Lab: 140 MMOL/L — ABNORMAL LOW (ref >60)

## 2016-12-31 LAB — PHOSPHORUS: Lab: 2.6 mg/dL (ref 2.0–4.5)

## 2016-12-31 LAB — CELL COUNT W/DIFF-FLUIDS
Lab: 1 %
Lab: 32 %
Lab: 62 /uL
Lab: 67 %

## 2016-12-31 LAB — LACTIC ACID(LACTATE)
Lab: 4.4 MMOL/L — ABNORMAL HIGH (ref 0.5–2.0)
Lab: 2.4 MMOL/L — ABNORMAL HIGH (ref 0.5–2.0)
Lab: 2.1 MMOL/L — ABNORMAL HIGH (ref 0.5–2.0)
Lab: 2.5 MMOL/L — ABNORMAL HIGH (ref 0.5–2.0)
Lab: 2.6 MMOL/L — ABNORMAL HIGH (ref >60)

## 2016-12-31 LAB — PROTIME INR (PT): Lab: 2 MMOL/L — ABNORMAL HIGH (ref >60)

## 2016-12-31 LAB — MAGNESIUM: Lab: 1.9 mg/dL — ABNORMAL HIGH (ref 1.6–2.6)

## 2016-12-31 MED ORDER — LACTULOSE 10 GRAM/15 ML PO SOLN
30 mL | ORAL | 0 refills | Status: DC
Start: 2016-12-31 — End: 2017-01-02
  Administered 2017-01-01 (×2): 20 g via ORAL

## 2016-12-31 MED ORDER — RIFAXIMIN 550 MG PO TAB
550 mg | ORAL_TABLET | Freq: Two times a day (BID) | ORAL | 3 refills | 30.00000 days | Status: AC
Start: 2016-12-31 — End: 2017-01-01

## 2016-12-31 NOTE — Progress Notes
Patient requesting a Rx for zofran prior to d/c. Will pass onto team.

## 2016-12-31 NOTE — Consults
Wound Ostomy Nursing Consult Service    NAME:Tristan Briggs                                                                   MRN: [id number]                 DOB:02-08-1980          AGE: 36 y.o.  ADMISSION DATE: 12/30/2016             DAYS ADMITTED: LOS: 1 day      Reason for Consult: pressure injury Stage II or greater    Assessment/Plan:    Active Problems:    Hepatic encephalopathy (HCC)    Alcoholic cirrhosis of liver with ascites (HCC)    Hepatorenal syndrome (HCC)    Hypokalemia      Pressure Injury 12/30/16 0651 Yes Sacrum Stage 3 (Active)   12/30/16 0651    Pressure Injury Present On Inpatient Admission: Y   Pressure Injury Orientation:    Wound Location: Sacrum   Pressure Injury Stages: Stage 3   If this pressure injury is suspected to be device related, please select the device::    Wound Dressing Status None 12/31/2016 10:50 AM   Wound Dressing and / or Treatment See Recs Below 12/31/2016 10:50 AM   Wound Drainage Description Serous 12/31/2016 10:50 AM   Wound Drainage Amount Scant 12/31/2016 10:50 AM   Wound Base Assessment Moist;Pink;Yellow;Slough 12/31/2016 10:50 AM   Surrounding Skin Assessment Dry;Intact;Pink 12/31/2016 10:50 AM   Wound Status (Wound Team Only) Being Treated 12/31/2016 10:50 AM   Wound Length (cm) 2.7 cm 12/31/2016 10:50 AM   Wound Width (cm) 1 cm 12/31/2016 10:50 AM   Wound Depth (cm) 0.3 cm 12/31/2016 10:50 AM   Wound Surface Area (cm^2) 2.7 cm^2 12/31/2016 10:50 AM   Wound Volume (cm^3) 0.81 cm^3 12/31/2016 10:50 AM       RECOMMEND:   Wound care -  - Clean wound with saline and gauze.  - Apply Antifungal barrier cream to intact, reddened skin around wound.  - Apply Medihoney gel to wound base.   - Cover site with 1/4 of an ABD dressing then secure with hypafix tape.  - Bedside RN to change daily.    - Please place pt on Envision mattress  - Continue q2 hr turning schedule using foam wedge for support.  - HOB less than or equal to 30 degrees, unless contraindicated, to prevent shearing at coccyx/sacrum.  - Nutrition consult if not already ordered.    Primary team is responsible for placing wound care orders. Bedside RN is responsible for dressing changes and implementing additional recs. Dressing supplies are available on unit supply cart and/or from [employer] [phone].    Will continue to follow.    Marcelino Duster, RN, BSN, [employer] Nursing Consult Service  Office: [phone]  Pager: [phone]  [facility] Team Pager (After Hours/Weekends): [phone]

## 2016-12-31 NOTE — Progress Notes
Progress Note - Medicine      Today's Date:  12/31/2016  Date of Admission: 12/30/2016  6:20 AM  HD # LOS: 1 day    Name: Tristan Briggs                DOB: 1980-03-15         Age: 36 y.o.       MRN:  [id number]         Assessment/Plan:     Active Problems:    Hepatic encephalopathy (HCC)    Alcoholic cirrhosis of liver with ascites (HCC)    Hepatorenal syndrome (HCC)    Hypokalemia    Moderate malnutrition (HCC)      ====================================================================================================    Mr. Jatinder Buffkin is a 36 y.o. male with PMH of alcoholism and cirrhosis.     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  - complicated by esophageal varices, variceal bleeding, ascites, hepatic encephalopathy   - MELD 25 12/27  - not a transplant candidate sec to lack of outside payor source, lack of documented sobriety  - abd [place] 12/30/16, cirrhotic liver morphology, no discrete hepatic mass, no detectable blood flow within the main portal vein, which is presumably occluded, minimal flow suggested within the right & left portal veins, which may be due to the presence of small venous collaterals, portal hypertension with mild splenomegaly and large volume ascites, gallbladder sludge, mild, nonspecific gallbladder wall thickening is most likely related to underlying liver disease  - high protein diet with protein supplement   - zinc supplementation     Decompensated cirrhosis  Hepatic encephalopathy  - Grade II-III on admission, significantly improved overnight  - continue lactulose, titrate to 3-5 BMs per day   - continue rifaximin, per hepatology, patient was not taking PTA, will likely need to complete patient assistance program application   - infectious w/u neg including urine, blood and ascites studies/cultures   - neg tylenol and alcohol serum levels  - send for urine alcohol screen   - acute hep panel neg   - blood cultures neg   - HIV neg     Ascites   - patient has been requiring weekly PTA - cannot obtain outpatient paras as no insurance currently   - s/p para 12/26 with 8.9L removed   - diuretics on hold due to AKI     Portal Vein Thrombosis  - as noted on abd [place] 12/26, no detectable blood flow within the main portal vein, which is presumably occluded, minimal flow suggested within the right & left portal veins, which may be due to the presence of small venous collaterals  - no plans for anticoagulation given h/o of esophageal variceal bleed requiring banding    Acute kidney injury on CKD - improving   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   - volume expanded with albumin and crystalloid  - cr improved since admission   - renal following  - urine lytes pre-renal   - hold diuretics  - continue octreotide  - midodrine on hold with normal pressures   - monitor I/O, strict     Lactic acidosis - improved  - likely secondary to infection, AKI and decreased clearance in setting of liver disease    Nausea and vomiting - resolved   - patient requesting rx for zofran on d/c    Hypokalemia - resolved  - monitor daily, aggressive replacement    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 Will need to get assistance for rifaximin. Will need SW assistance for medicaid application.      Crista Elliot, MD  Med Private [phone]      __________________________________________________________________________________  Subjective: No acute overnight events. Patient reports that he is feeling significantly better this morning. Had 5 BMs already today. Confusion is improved. Appetite good. No fever or chills. No abdominal pain.       Objective:  Medications:  Scheduled Meds:  cefTRIAXone (ROCEPHIN) IVP 1 g 1 g Intravenous Q24H*   heparin (porcine) PF syringe 5,000 Units 5,000 Units Subcutaneous Q8H   insulin aspart U-100 (NOVOLOG FLEXPEN) injection PEN 0-7 Units 0-7 Units Subcutaneous 5 X Day   lactulose oral solution 20 g 30 mL Oral Q6H   octreotide (SANDOSTATIN) injection 100 mcg 100 mcg Subcutaneous TID   rifAXIMin (XIFAXAN) tablet 550 mg 550 mg Oral BID   zinc sulfate capsule 220 mg 220 mg Oral QDAY   Continuous Infusions:  PRN and Respiratory Meds:acetaminophen Q6H PRN, magnesium sulfate PRN (On Call from Rx), milk of magnesia (CONC) Q6H PRN, ondansetron (ZOFRAN) IV Q6H PRN, potassium chloride SR PRN **OR** potassium chloride PRN       Vitals:                Vital Signs:  Last Filed             Vital Signs:  24 Hour Range   BP: 117/74 (12/27 1229)  Temp: 37.1 ???C (98.7 ???F) (12/27 1229)  Pulse: 100 (12/27 1229)  Respirations: 18 PER MINUTE (12/27 1229)  SpO2: 100 % (12/27 1229)  O2 Delivery: None (Room Air) (12/27 1229)  SpO2 Pulse: 103 (12/26 1430)  BP: (109-135)/(71-100)   Temp:  [36.5 ???C (97.7 ???F)-37.1 ???C (98.8 ???F)]   Pulse:  [92-114]   Respirations:  [11 PER MINUTE-20 PER MINUTE]   SpO2:  [98 %-100 %]   O2 Delivery: None (Room Air)   Intensity Pain Scale (Self Report): (not recorded) Vitals:    12/30/16 0632   Weight: 82.4 kg (181 lb 11.2 oz)       Wt Readings from Last 1 Encounters:   12/30/16 82.4 kg (181 lb 11.2 oz)       PIPP Score      FLACC         Intake/Output Summary :  (Last 24 hours)    Intake/Output Summary (Last 24 hours) at 12/31/2016 1323  Last data filed at 12/31/2016 1230  Gross per 24 hour   Intake 500 ml   Output 983.85 ml   Net -483.85 ml       Physical Exam:  Body mass index is 23.33 kg/m???. Constitutional: no acute distress, alert  Head: nc/at  Eyes: sclera icteric   Mouth: MMM, no lesions   Heart: tachycardic, no murmur  Lungs: clear bilaterally   Abd: soft, distended, ascites present, hyperactive bowel sounds  Ext: no edema, muscle wasting   Psych: normal mood and affect   Skin: jaundiced     Lab Review  Recent Labs      12/30/16   0800  12/30/16   1535  12/31/16   0230   NA  138  142  140   K  2.8*  3.0*  3.6   CL  105  111*  111*   CO2  16*  18*  17*   GAP  17*  13*  12   BUN  54*  47*  42*   CR  2.75*  2.01*  1.86*   GLU  192*  162*  136*   CA  9.3  9.3  9.3   ALBUMIN  4.3   --   4.5   MG  1.7  2.0  1.9   PO4  4.1   --   2.6       Recent Labs      12/30/16   0635  12/30/16   0800  12/30/16   1535  12/31/16   0230   WBC  10.0  9.0   --   7.2   HGB  9.9*  9.6*   --   9.0*   HCT  29.2*  27.7*   --   27.0*   PLTCT  133*  136*   --   108*   INR  1.8*  1.9*  2.2*  2.0*   PTT  35.0   --    --    --    AST   --   35   --   27   ALT   --   11   --   7   ALKPHOS   --   49   --   45      Estimated Creatinine Clearance: 64 mL/min (A) (based on SCr of 1.86 mg/dL (H)).  Vitals:    12/30/16 1610   Weight: 82.4 kg (181 lb 11.2 oz)    No results for input(s): PHART, PO2ART in the last 72 hours.    Invalid input(s): PC02A      Point of Care Testing  (Last 24 hours)  Glucose: (!) 136 (12/31/16 0230)  POC Glucose (Download): (!) 151 (12/31/16 1225)    Radiology and other Diagnostics Review:    Pertinent radiology reviewed.

## 2016-12-31 NOTE — Progress Notes
Renal Progress Note    Name:  Tristan Briggs   QMVHQ'I Date:  12/31/2016  Admission Date: 12/30/2016  LOS: 1 day          Assessment and Plan   Active Problems:    Hepatic encephalopathy (HCC)    Alcoholic cirrhosis of liver with ascites (HCC)    Hepatorenal syndrome (HCC)    Hypokalemia    Moderate malnutrition (HCC)        Tristan Briggs is a 36 y.o. male    AKI  - multiple recent admissions due to decompensated cirrhosis and requiring frequent large volume paracenteses; most recent admission 12/16/2016 to 12/22/2016 where he had 13 L removed, complicated with hepatorenal syndrome and at discharge his creatinine was 4.25   - Cr. 2.75 on 12/30/16; U Na 16, FeNa 0.44%  - urine microscopy with acellular casts; no significant muddy brown casts noted  ???  Hypokalemia   ???  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  ???  ???  Recommendations:  - avoid nephrotoxins  - cont. to monitor  - renally dose medications  - strict I/Os  - daily weights; standing if able  - hold diuretics for now  - urine lytes suggestive of pre-renal (see above)  - urine microscopy (see above)  - cont. octreotide for now  - Cr. improving   ???  ???  pt discussed with Dr. Hulan Amato  ???  Nancy Fetter, MD  Nephrology fellow  Pager [phone]        Subjective     Tristan Briggs is a 36 y.o. male with significant improvement in mental status; is alert and oriented;       Medications     Medications    MEDS  heparin (porcine) PF 5,000 Units Subcutaneous Q8H   lactulose 30 mL Oral Q6H   octreotide 100 mcg Subcutaneous TID   rifAXIMin 550 mg Oral BID   zinc sulfate 220 mg Oral QDAY    IV MEDS  Prn acetaminophen Q6H PRN, magnesium sulfate PRN (On Call from Rx), milk of magnesia (CONC) Q6H PRN, ondansetron (ZOFRAN) IV Q6H PRN, potassium chloride SR PRN **OR** potassium chloride PRN           Physical Exam     Vital Signs: Last Filed In 24 Hours Vital Signs: 24 Hour Range BP: 124/86 (12/27 1644)  Temp: 36.9 ???C (98.5 ???F) (12/27 1644)  Pulse: 103 (12/27 1644)  Respirations: 18 PER MINUTE (12/27 1644)  SpO2: 100 % (12/27 1644)  O2 Delivery: None (Room Air) (12/27 1644) BP: (109-133)/(71-91)   Temp:  [36.6 ???C (97.9 ???F)-37.1 ???C (98.8 ???F)]   Pulse:  [99-111]   Respirations:  [17 PER MINUTE-18 PER MINUTE]   SpO2:  [98 %-100 %]   O2 Delivery: None (Room Air)            Intake/Output Summary (Last 24 hours) at 12/31/2016 1725  Last data filed at 12/31/2016 1230  Gross per 24 hour   Intake 500 ml   Output 525 ml   Net -25 ml      Vitals:    12/30/16 9528   Weight: 82.4 kg (181 lb 11.2 oz)       General:  Alert, cooperative, no distress   Head:  Normocephalic, without obvious abnormality, atraumatic  Eyes: sceral icterus  Throat:  Lips and mucosa normal.   Neck:  Supple, no adenopathy  Lungs:  CTAB  Heart:    RRR; S1, S2 normal; No M/R/G  Abdomen:  Soft, distended; non-tender  Extremities:  Extremities normal, atraumatic, no cyanosis or edema  Skin:   Dry skin; No rashes or lesions  Neurologic:  CNII - XII grossly intact.         Labs:      Recent Labs      12/30/16   0800  12/30/16   1535  12/31/16   0230   NA  138  142  140   K  2.8*  3.0*  3.6   CL  105  111*  111*   CO2  16*  18*  17*   GAP  17*  13*  12   BUN  54*  47*  42*   CR  2.75*  2.01*  1.86*   GLU  192*  162*  136*   CA  9.3  9.3  9.3   ALBUMIN  4.3   --   4.5   MG  1.7  2.0  1.9   PO4  4.1   --   2.6       Recent Labs      12/30/16   0635  12/30/16   0800  12/30/16   1535  12/31/16   0230   WBC  10.0  9.0   --   7.2   HGB  9.9*  9.6*   --   9.0*   HCT  29.2*  27.7*   --   27.0*   PLTCT  133*  136*   --   108*   INR  1.8*  1.9*  2.2*  2.0*   PTT  35.0   --    --    --    AST   --   35   --   27   ALT   --   11   --   7   ALKPHOS   --   49   --   45      Estimated Creatinine Clearance: 64 mL/min (A) (based on SCr of 1.86 mg/dL (H)).  Vitals:    12/30/16 1610   Weight: 82.4 kg (181 lb 11.2 oz) No results for input(s): PHART, PO2ART in the last 72 hours.    Invalid input(s): PC02A

## 2016-12-31 NOTE — Progress Notes
General Progress Note    Name:  Tristan Briggs   Today's Date:  12/31/2016  Admission Date: 12/30/2016  LOS: 1 day                     Assessment/Plan:    Active Problems:    Hepatic encephalopathy (HCC)    Alcoholic cirrhosis of liver with ascites (HCC)    Hepatorenal syndrome (HCC)    Hypokalemia    Moderate malnutrition (HCC)    Tristan Briggs is a 36 yr old male with past medical history of alcoholic cirrhosis complicated with ascites, hepatic encephalopathy presents to the [facility] as a transfer from outside facility for hepatic encephalopathy, acute kidney injury and abdominal distention.     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  MELD-Na score: 25 at 12/31/2016  2:30 AM  MELD score: 25 at 12/31/2016  2:30 AM  Calculated from:  Serum Creatinine: 1.86 MG/DL at 54/09/8117  1:47 AM  Serum Sodium: 140 MMOL/L (Rounded to 137 MMOL/L) at 12/31/2016  2:30 AM  Total Bilirubin: 3.9 MG/DL at 82/95/6213  0:86 AM  INR(ratio): 2 at 12/31/2016  2:30 AM  Age: 36 years  - not a transplant candidate sec to lack of outside payor source, lack of documented sobriety  - patient assessed by Transplant SW, Nicanor Bake 12/27, note pending  - abd [place] 12/30/16, cirrhotic liver morphology, no discrete hepatic mass, no detectable blood flow within the main portal vein, which is presumably occluded, minimal flow suggested within the right & left portal veins, which may be due to the presence of small venous collaterals, portal hypertension with mild splenomegaly and large volume ascites, gallbladder sludge, mild, nonspecific gallbladder wall thickening is most likely related to underlying liver disease    Hepatic encephalopathy  - MS stable, no asterixis  - no evidence of active infection  - continue Rifaximin, Zinc sulfate and Lactulose titrated to 3-4 BMs daily  - avoid narcotics and sedating medications  - per patient, was not taking Rifaximin PTA, will need to determine if patient will discharge with Rifaximin and complete patient assistance application  - OK to use Tylenol however limit to 2gm daily in setting of cirrhosis    Ascites  - requiring weekly paracentesis, patient has no ability to be scheduled for outpatient paracentesis due to lack of payor source at this time, all paracentesis have been done on admission or just prior to admission to the hospital  - paracentesis 12/26, removed 8.9L fluid, no SBP, not sent for cultures  - low sodium diet    Portal vein thrombosis  - as noted on abd [place] 12/26, no detectable blood flow within the main portal vein, which is presumably occluded, minimal flow suggested within the right & left portal veins, which may be due to the presence of small venous collaterals  - no plans for anticoagulation given h/o of esophageal variceal bleed requiring banding    Variceal screening  - h/o variceal bleeding s/p banding in 06/2016 at [facility] facility  - repeat EGD 11/2016 at [facility], [place], normal examined duodenum, moderate, diffuse portal hypertensive gastropathy in the gastric fundus and in the gastric body, three columns of non-bleeding grade 1 varices found in the lower third of the esophagus, small in size, no stigmata of recent bleeding, no red wale signs  - no evidence of active bleeding at this time, Hgb 9.0 <-- 9.6 <-- 9.9    Colon screening  - per patient, has never had  colonoscopy    AKI  - in setting of ESLD, HRS  - recently discharged from [facility], [place] on Midodrine 10mg  TID and Octreotide TID per patient - patient reports accessing Octreotide as outpatient via a voucher from [employer]. Thelma Barge, he has 16 vials and 10 ampoules at home but does not have filtering syringes to use with ampoules   - patient reports prn paracentesis through ED at hospital in [place], reports up to 10L removed but was not administered Albumin replacement with paracentesis   - unknown baseline Cr, Cr on admission 2.8 - Cr 1.86 <-- 2.01, UO 868ml/24hrs as recorded  - not on diuretics PTA given worsening renal function  - s/p 25% Albumin, serum albumin currently 4.5  - continuing Octreotide TID, Midodrine has been DCd, BP 110-130s, MAP 80-100s  - accurate I&O  - Renal following    HCC screening  - no discrete hepatic mass on abd [place] 12/26    FEN  - low sodium diet  - aggressive nutritional support, supplements    Disposition  - to be determined, patient has no outside payor source    Patient discussed with Dr. Constance Goltz.   ________________________________________________________________________    Subjective  Tristan Briggs is a 36 y.o. male feeling better since admission. No longer having recurrent vomiting. Reports tolerating PO, eating well, taking supplements. Denies fever, SOB, CP, abd pain, n/v. Has had 5 BMs today. Occasionally dizzy with position changes too quickly. No falls since he was at hospital in [place], prior to transfer.     Medications  Scheduled Meds:  heparin (porcine) PF syringe 5,000 Units 5,000 Units Subcutaneous Q8H   lactulose oral solution 20 g 30 mL Oral Q6H   octreotide (SANDOSTATIN) injection 100 mcg 100 mcg Subcutaneous TID   rifAXIMin (XIFAXAN) tablet 550 mg 550 mg Oral BID   zinc sulfate capsule 220 mg 220 mg Oral QDAY   Continuous Infusions:  PRN and Respiratory Meds:acetaminophen Q6H PRN, magnesium sulfate PRN (On Call from Rx), milk of magnesia (CONC) Q6H PRN, ondansetron (ZOFRAN) IV Q6H PRN, potassium chloride SR PRN **OR** potassium chloride PRN    Objective:                          Vital Signs: Last Filed                 Vital Signs: 24 Hour Range   BP: 117/74 (12/27 1229)  Temp: 37.1 ???C (98.7 ???F) (12/27 1229)  Pulse: 100 (12/27 1229)  Respirations: 18 PER MINUTE (12/27 1229)  SpO2: 100 % (12/27 1229)  O2 Delivery: None (Room Air) (12/27 1229) BP: (109-133)/(71-91)   Temp:  [36.5 ???C (97.7 ???F)-37.1 ???C (98.8 ???F)]   Pulse:  [92-111]   Respirations:  [16 PER MINUTE-18 PER MINUTE] SpO2:  [98 %-100 %]   O2 Delivery: None (Room Air)     Vitals:    12/30/16 1610   Weight: 82.4 kg (181 lb 11.2 oz)       Intake/Output Summary:  (Last 24 hours)    Intake/Output Summary (Last 24 hours) at 12/31/2016 1454  Last data filed at 12/31/2016 1230  Gross per 24 hour   Intake 500 ml   Output 975 ml   Net -475 ml      Stool Occurrence: 1    Physical Exam  Gen: alert, oriented, in no distress, cachectic  HEENT: NCAT, + scleral icterus, poor dentition  Resp: CTAB, non labored at  rest, RA  CV: RRR, no LE edema  Abd: soft, mildly distended, non tender, BS+  Skin: jaundiced, multiple tattoos  Ext: thin, muscle wasting  Neuro: non focal exam, no asterixis or tremor    Lab Review  24-hour labs:    Results for orders placed or performed during the hospital encounter of 12/30/16 (from the past 24 hour(s))   URINALYSIS DIPSTICK    Collection Time: 12/30/16  3:26 PM   Result Value Ref Range    Color,UA YELLOW     Turbidity,UA CLEAR CLEAR-CLEAR    Specific Gravity-Urine 1.010 1.003 - 1.035    pH,UA 5.0 5.0 - 8.0    Protein,UA NEG NEG-NEG    Glucose,UA NEG NEG-NEG    Ketones,UA NEG NEG-NEG    Bilirubin,UA NEG NEG-NEG    Blood,UA NEG NEG-NEG    Urobilinogen,UA NORMAL NORM-NORMAL    Nitrite,UA NEG NEG-NEG    Leukocytes,UA NEG NEG-NEG    Urine Ascorbic Acid, UA NEG NEG-NEG   URINALYSIS, MICROSCOPIC    Collection Time: 12/30/16  3:26 PM   Result Value Ref Range    WBCs,UA 2-10 0 - 2 /HPF    RBCs,UA 0-2 0 - 3 /HPF    MucousUA TRACE     Hyaline Cast 0-2    SODIUM-URINE RANDOM    Collection Time: 12/30/16  3:26 PM   Result Value Ref Range    Sodium, Random 16 MMOL/L   UREA NITROGEN-URINE RANDOM    Collection Time: 12/30/16  3:26 PM   Result Value Ref Range    Urea Nitrogen 448 MG/DL   CREATININE-URINE RANDOM    Collection Time: 12/30/16  3:26 PM   Result Value Ref Range    Creatinine, Random 58 MG/DL   POC GLUCOSE    Collection Time: 12/30/16  3:33 PM   Result Value Ref Range    Glucose, POC 156 (H) 70 - 100 MG/DL LACTIC ACID(LACTATE)    Collection Time: 12/30/16  3:35 PM   Result Value Ref Range    Lactic Acid 3.6 (H) 0.5 - 2.0 MMOL/L   BASIC METABOLIC PANEL    Collection Time: 12/30/16  3:35 PM   Result Value Ref Range    Sodium 142 137 - 147 MMOL/L    Potassium 3.0 (L) 3.5 - 5.1 MMOL/L    Chloride 111 (H) 98 - 110 MMOL/L    CO2 18 (L) 21 - 30 MMOL/L    Anion Gap 13 (H) 3 - 12    Glucose 162 (H) 70 - 100 MG/DL    Blood Urea Nitrogen 47 (H) 7 - 25 MG/DL    Creatinine 1.61 (H) 0.4 - 1.24 MG/DL    Calcium 9.3 8.5 - 09.6 MG/DL    eGFR Non African American 38 (L) >60 mL/min    eGFR African American 46 (L) >60 mL/min   MAGNESIUM    Collection Time: 12/30/16  3:35 PM   Result Value Ref Range    Magnesium 2.0 1.6 - 2.6 mg/dL   PROTIME INR (PT)    Collection Time: 12/30/16  3:35 PM   Result Value Ref Range    INR 2.2 (H) 0.8 - 1.2   CULTURE-BLOOD W/SENSITIVITY    Collection Time: 12/30/16  4:00 PM   Result Value Ref Range    Battery Name BLOOD CULTURE     Specimen Description BLOOD  RIGHT  FA       Special Requests NONE     Culture NO GROWTH 1 DAY  Report Status     CULTURE-BLOOD W/SENSITIVITY    Collection Time: 12/30/16  4:10 PM   Result Value Ref Range    Battery Name BLOOD CULTURE     Specimen Description BLOOD  LEFT  FA       Special Requests NONE     Culture NO GROWTH 1 DAY     Report Status     POC GLUCOSE    Collection Time: 12/30/16  8:52 PM   Result Value Ref Range    Glucose, POC 192 (H) 70 - 100 MG/DL   LACTIC ACID(LACTATE)    Collection Time: 12/30/16  9:18 PM   Result Value Ref Range    Lactic Acid 4.4 (HH) 0.5 - 2.0 MMOL/L   CBC    Collection Time: 12/31/16  2:30 AM   Result Value Ref Range    White Blood Cells 7.2 4.5 - 11.0 K/UL    RBC 2.92 (L) 4.4 - 5.5 M/UL    Hemoglobin 9.0 (L) 13.5 - 16.5 GM/DL    Hematocrit 09.8 (L) 40 - 50 %    MCV 92.4 80 - 100 FL    MCH 30.8 26 - 34 PG    MCHC 33.3 32.0 - 36.0 G/DL    RDW 11.9 (H) 11 - 15 %    Platelet Count 108 (L) 150 - 400 K/UL    MPV 8.9 7 - 11 FL COMPREHENSIVE METABOLIC PANEL    Collection Time: 12/31/16  2:30 AM   Result Value Ref Range    Sodium 140 137 - 147 MMOL/L    Potassium 3.6 3.5 - 5.1 MMOL/L    Chloride 111 (H) 98 - 110 MMOL/L    Glucose 136 (H) 70 - 100 MG/DL    Blood Urea Nitrogen 42 (H) 7 - 25 MG/DL    Creatinine 1.47 (H) 0.4 - 1.24 MG/DL    Calcium 9.3 8.5 - 82.9 MG/DL    Total Protein 5.4 (L) 6.0 - 8.0 G/DL    Total Bilirubin 3.9 (H) 0.3 - 1.2 MG/DL    Albumin 4.5 3.5 - 5.0 G/DL    Alk Phosphatase 45 25 - 110 U/L    AST (SGOT) 27 7 - 40 U/L    CO2 17 (L) 21 - 30 MMOL/L    ALT (SGPT) 7 7 - 56 U/L    Anion Gap 12 3 - 12    eGFR Non African American 41 (L) >60 mL/min    eGFR African American 50 (L) >60 mL/min   PROTIME INR (PT)    Collection Time: 12/31/16  2:30 AM   Result Value Ref Range    INR 2.0 (H) 0.8 - 1.2   LACTIC ACID(LACTATE)    Collection Time: 12/31/16  2:30 AM   Result Value Ref Range    Lactic Acid 2.4 (H) 0.5 - 2.0 MMOL/L   MAGNESIUM    Collection Time: 12/31/16  2:30 AM   Result Value Ref Range    Magnesium 1.9 1.6 - 2.6 mg/dL   PHOSPHORUS    Collection Time: 12/31/16  2:30 AM   Result Value Ref Range    Phosphorus 2.6 2.0 - 4.5 MG/DL   POC GLUCOSE    Collection Time: 12/31/16  3:50 AM   Result Value Ref Range    Glucose, POC 134 (H) 70 - 100 MG/DL   LACTIC ACID(LACTATE)    Collection Time: 12/31/16  7:49 AM   Result Value Ref Range    Lactic Acid 2.1 (H)  0.5 - 2.0 MMOL/L   POC GLUCOSE    Collection Time: 12/31/16  8:17 AM   Result Value Ref Range    Glucose, POC 128 (H) 70 - 100 MG/DL   LACTIC ACID(LACTATE)    Collection Time: 12/31/16 11:25 AM   Result Value Ref Range    Lactic Acid 2.5 (H) 0.5 - 2.0 MMOL/L   POC GLUCOSE    Collection Time: 12/31/16 12:25 PM   Result Value Ref Range    Glucose, POC 151 (H) 70 - 100 MG/DL       Point of Care Testing  (Last 24 hours)  Glucose: (!) 136 (12/31/16 0230)  POC Glucose (Download): (!) 151 (12/31/16 1225)    Radiology and other Diagnostics Review:    Pertinent radiology reviewed. Silvio Clayman, APRN   Pager [phone]

## 2016-12-31 NOTE — Progress Notes
PHYSICAL THERAPY  NOTE         PT orders received and chart reviewed. Patient denies changes from baseline mobility and reports no history of balance loss or falls in the last three months.  Patient endorses no concerns with mobility at home.  Currently, the patient is ambulating in room without difficulty. Encouraged patient to progress mobility to hallway with staff, patient in agreement.     Reports he has a wheelchair at home that he has used in the past when he would wait too long to have a paracentesis (last use of wheelchair patient reports was early November 2018). Reports having assistance at home from family if needed but was independent at baseline. Also reports having a cane at home. Patient reports independence with ADLs stating he took a shower on his own earlier this date. Anticipate patient will be safe to discharge home with family.       Encouraged patient to continue mobilization while in the hospital and discussed the mobility plan with the bedside nursing staff.  Physical therapy services will be discontinued at this time, please re-consult if the patient has a change in mobility status.        Therapist: Versie Starkslare Tiyah Zelenak, PT, DPT [phone]  Date: 12/31/2016

## 2016-12-31 NOTE — Progress Notes
OCCUPATIONAL THERAPY        Per discussion with PT, patient reports no concerns with completing activities of daily living with no OT goals identified. OT will discontinue service, please re-consult if the patient has a decline in functional status.         Shanik Brookshire May OTR/L [id number]

## 2016-12-31 NOTE — Progress Notes
CRITICAL RESULT NOTIFICATION    Critical result or procedure called (document test and value, and read back): lactic acid 4.4  Time MD/NP Notified: 2215  MD/NP Name: Clarita CraneJeep  MD/NP Response/Orders Given: give albumin

## 2016-12-31 NOTE — Case Management (ED)
Inpatient Medication Voucher Assessment    Date:  12/31/2016    Primary Insurance: No  Rx Coverage: No  Medicaid pending: No  Medicare Part D [place]: No  VA Benefits (include location of services if applicable): No    Is patient eligible for a Medication Voucher? No     Date of last Medication Voucher: n/a   Charitable Fund to be utilized (include name of fund if applicable): No   Narcotics can be included on voucher: No   Other information: pt applying for for [employer] and SSDI    Reminders:    SW has authorized a Medication Voucher for up to $125.  If Voucher is over this amount, please contact SW.     Vouchers are to be provided once during a 3957-month period.    Vouchers cannot include medications on the Generic Drug List ($4.99) or co-pays.   Vouchers should include only new medications; ongoing refills should not be authorized.  No more than a 30-day supply should be authorized. Please contact SW with needs outside of these parameters.      Letta KocherKathleen Esabella Stockinger, LMSW  Pager/Cell: [phone]

## 2017-01-01 ENCOUNTER — Encounter: Admit: 2017-01-01 | Discharge: 2017-01-01

## 2017-01-01 ENCOUNTER — Inpatient Hospital Stay: Admit: 2016-12-30 | Discharge: 2016-12-30

## 2017-01-01 DIAGNOSIS — L89153 Pressure ulcer of sacral region, stage 3: ICD-10-CM

## 2017-01-01 DIAGNOSIS — R739 Hyperglycemia, unspecified: ICD-10-CM

## 2017-01-01 DIAGNOSIS — E876 Hypokalemia: ICD-10-CM

## 2017-01-01 DIAGNOSIS — E874 Mixed disorder of acid-base balance: ICD-10-CM

## 2017-01-01 DIAGNOSIS — D638 Anemia in other chronic diseases classified elsewhere: ICD-10-CM

## 2017-01-01 DIAGNOSIS — K767 Hepatorenal syndrome: ICD-10-CM

## 2017-01-01 DIAGNOSIS — K7031 Alcoholic cirrhosis of liver with ascites: ICD-10-CM

## 2017-01-01 DIAGNOSIS — F1020 Alcohol dependence, uncomplicated: ICD-10-CM

## 2017-01-01 DIAGNOSIS — K7290 Hepatic failure, unspecified without coma: Principal | ICD-10-CM

## 2017-01-01 DIAGNOSIS — E440 Moderate protein-calorie malnutrition: ICD-10-CM

## 2017-01-01 DIAGNOSIS — D6959 Other secondary thrombocytopenia: ICD-10-CM

## 2017-01-01 DIAGNOSIS — Z6823 Body mass index (BMI) 23.0-23.9, adult: ICD-10-CM

## 2017-01-01 DIAGNOSIS — K766 Portal hypertension: ICD-10-CM

## 2017-01-01 DIAGNOSIS — E875 Hyperkalemia: ICD-10-CM

## 2017-01-01 DIAGNOSIS — N182 Chronic kidney disease, stage 2 (mild): ICD-10-CM

## 2017-01-01 DIAGNOSIS — I81 Portal vein thrombosis: ICD-10-CM

## 2017-01-01 DIAGNOSIS — K7040 Alcoholic hepatic failure without coma: Principal | ICD-10-CM

## 2017-01-01 DIAGNOSIS — R64 Cachexia: ICD-10-CM

## 2017-01-01 DIAGNOSIS — N179 Acute kidney failure, unspecified: ICD-10-CM

## 2017-01-01 LAB — IRON + BINDING CAPACITY + %SAT+ FERRITIN
Lab: 118 ug/dL — ABNORMAL LOW (ref 270–380)
Lab: 85 ug/dL — ABNORMAL LOW (ref 50–185)

## 2017-01-01 LAB — COMPREHENSIVE METABOLIC PANEL: Lab: 134 MMOL/L — ABNORMAL LOW (ref >60)

## 2017-01-01 LAB — FOLATE, SERUM: Lab: 13 ng/mL (ref >3.9)

## 2017-01-01 LAB — LACTIC ACID(LACTATE)
Lab: 3.5 MMOL/L — ABNORMAL HIGH (ref >60)
Lab: 2.1 MMOL/L — ABNORMAL HIGH (ref 0.5–2.0)
Lab: 1.1 MMOL/L — ABNORMAL HIGH (ref 0.5–2.0)
Lab: 1.5 MMOL/L (ref 0.5–2.0)

## 2017-01-01 LAB — MAGNESIUM: Lab: 1.5 mg/dL — ABNORMAL LOW (ref >60)

## 2017-01-01 LAB — PHOSPHORUS: Lab: 3.6 mg/dL — ABNORMAL LOW (ref 2.0–4.5)

## 2017-01-01 LAB — VITAMIN B12: Lab: 619 pg/mL — ABNORMAL HIGH (ref 180–914)

## 2017-01-01 LAB — CBC: Lab: 7.2 10*3/uL — ABNORMAL HIGH (ref >60)

## 2017-01-01 LAB — RETICULOCYTE COUNT
Lab: 1.3 % (ref 3–12)
Lab: 2.3 % — ABNORMAL HIGH (ref 0.5–2.0)

## 2017-01-01 LAB — PROTIME INR (PT): Lab: 1.8 MMOL/L — ABNORMAL HIGH (ref 0.8–1.2)

## 2017-01-01 LAB — ETHYL GLUCURONIDE SCREEN WITH REFLEX, URINE: Lab: NEGATIVE — AB

## 2017-01-01 MED ORDER — ONDANSETRON HCL 4 MG PO TAB
4 mg | ORAL_TABLET | ORAL | 0 refills | 8.00000 days | Status: AC | PRN
Start: 2017-01-01 — End: 2017-01-12
  Filled 2017-01-01 (×2): qty 20, 7d supply, fill #1

## 2017-01-01 MED ORDER — RIFAXIMIN 550 MG PO TAB
550 mg | ORAL_TABLET | Freq: Two times a day (BID) | ORAL | 3 refills | 30.00000 days | Status: AC
Start: 2017-01-01 — End: 2017-12-14

## 2017-01-01 MED ORDER — MELATONIN 3 MG PO TAB
3 mg | Freq: Every evening | ORAL | 0 refills | Status: DC
Start: 2017-01-01 — End: 2017-01-02
  Administered 2017-01-01: 07:00:00 3 mg via ORAL

## 2017-01-01 MED ORDER — OCTREOTIDE ACETATE 100 MCG/ML (1 ML) IJ SYRG
100 ug | Freq: Three times a day (TID) | SUBCUTANEOUS | 3 refills | 30.00000 days | Status: AC
Start: 2017-01-01 — End: 2017-01-12

## 2017-01-01 MED ORDER — MELATONIN 3 MG PO TAB
3 mg | ORAL_TABLET | Freq: Every evening | ORAL | 3 refills | 28.00000 days | Status: AC
Start: 2017-01-01 — End: 2017-04-13

## 2017-01-01 MED ORDER — ZINC SULFATE 220 (50) MG PO CAP
220 mg | ORAL_CAPSULE | Freq: Every day | ORAL | 3 refills | Status: AC
Start: 2017-01-01 — End: 2018-09-15

## 2017-01-01 NOTE — Discharge Instructions
Your wound team nurse made the following recommendations for the wound on your lower back:    Clean wound with saline and gauze.   - Apply Antifungal barrier cream to intact, reddened skin around wound.   - Apply Medihoney gel to wound base.   - Cover the site with 1/4 of an ABD dressing, then secure with hypafix tape.   - Change daily.     Positioning:  - Continue turning or repositioning every two hours and using foam wedge for support.   - When in bed, keep your head elevated at less than or equal to 30 degrees to prevent shearing at coccyx/sacrum.

## 2017-01-01 NOTE — Discharge Instructions
Your dietician recommends for you to supplement your meals with Boost Plus at breakfast, lunch, and dinner.

## 2017-01-01 NOTE — Progress Notes
Discharge instructions complete with Pt. Pt. Verbalizes understanding of his plan of care. Pt. To follow up with hepatology on Jan. 8th. Pt.'s medications delivered to bedside and wound supplies sent with Pt. IVs removed and Pt. Taken to lobby via wheelchair with nursing staff.

## 2017-01-01 NOTE — Progress Notes
Renal Progress Note    Name:  Tristan Briggs   Today's Date:  01/01/2017  Admission Date: 12/30/2016  LOS: 2 days          Assessment and Plan   Active Problems:    Alcoholic cirrhosis of liver with ascites (HCC)    Hepatorenal syndrome (HCC)    Secondary esophageal varices without bleeding (HCC)    Ascites    AKI (acute kidney injury) (HCC)    CKD (chronic kidney disease) stage 2, GFR 60-89 ml/min    Portal vein thrombosis    Decubitus ulcer of sacral region, stage 3 (HCC)    Moderate protein-calorie malnutrition (HCC)    Normocytic anemia    Anemia of chronic disease    Thrombocytopenia (HCC)        Tristan Briggs is a 36 y.o. male    AKI  - multiple recent admissions due to decompensated cirrhosis and requiring frequent large volume paracenteses; most recent admission 12/16/2016 to 12/22/2016 where he had 13 L removed, complicated with hepatorenal syndrome and at discharge his creatinine was 4.25   - Cr. 2.75 on 12/30/16; U Na 16, FeNa 0.44%  - urine microscopy with acellular casts; no significant muddy brown casts noted  AKI is likely prerenal in nature. Baseline cr unclear.  ???  Hypokalemia   ???  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  ???  ???  Recommendations:  - avoid nephrotoxins  - cont. to monitor  - renally dose medications  - strict I/Os  - daily weights; standing if able  - hold diuretics for now  - it appears that the pt has octreotide at home which he can continue on d/c  - he is ok for d/c from nephrology stand point. Need BMP follow up in 1 week.   - Case discussed with Dr. Ladona Ridgel  ???  ???  Tristan Peak, MD  [phone]        Subjective     Tristan Briggs is a 36 y.o. male with significant improvement in mental status; is alert and oriented;       Medications     Medications    MEDS    heparin (porcine) PF 5,000 Units Subcutaneous Q8H   lactulose 30 mL Oral Q6H   melatonin 3 mg Oral QHS   octreotide 100 mcg Subcutaneous TID rifAXIMin 550 mg Oral BID   zinc sulfate 220 mg Oral QDAY    IV MEDS  Prn acetaminophen Q6H PRN, magnesium sulfate PRN (On Call from Rx) 1 g at 01/01/17 1242, milk of magnesia (CONC) Q6H PRN, ondansetron (ZOFRAN) IV Q6H PRN 4 mg at 01/01/17 1212, potassium chloride SR PRN 40 mEq at 01/01/17 1131 **OR** potassium chloride PRN           Physical Exam     Vital Signs: Last Filed In 24 Hours Vital Signs: 24 Hour Range   BP: 114/82 (12/28 1228)  Temp: 36.8 ???C (98.2 ???F) (12/28 1228)  Pulse: 94 (12/28 1228)  Respirations: 18 PER MINUTE (12/28 1228)  SpO2: 94 % (12/28 1228)  O2 Delivery: None (Room Air) (12/28 1228) BP: (105-129)/(58-86)   Temp:  [36.8 ???C (98.2 ???F)-37.2 ???C (98.9 ???F)]   Pulse:  [68-110]   Respirations:  [18 PER MINUTE]   SpO2:  [94 %-100 %]   O2 Delivery: None (  Room Air)            Intake/Output Summary (Last 24 hours) at 01/01/2017 1422  Last data filed at 01/01/2017 0500  Gross per 24 hour   Intake 960 ml   Output 450 ml   Net 510 ml      Vitals:    12/30/16 1610   Weight: 82.4 kg (181 lb 11.2 oz)       General:  Alert, cooperative, no distress   Head:  Normocephalic, without obvious abnormality, atraumatic  Eyes: sceral icterus   Throat:  Lips and mucosa normal.   Neck:  Supple, no adenopathy  Lungs:  CTAB  Heart:    RRR; S1, S2 normal; No M/R/G  Abdomen:  Soft, distended; non-tender  Extremities:  Extremities normal, atraumatic, no cyanosis or edema  Skin:   Dry skin; No rashes or lesions  Neurologic:  CNII - XII grossly intact.         Labs:      Recent Labs      12/30/16   0800  12/30/16   1535  12/31/16   0230  01/01/17   0559   NA  138  142  140  134*   K  2.8*  3.0*  3.6  3.6   CL  105  111*  111*  107   CO2  16*  18*  17*  17*   GAP  17*  13*  12  10   BUN  54*  47*  42*  44*   CR  2.75*  2.01*  1.86*  1.76*   GLU  192*  162*  136*  125*   CA  9.3  9.3  9.3  8.8   ALBUMIN  4.3   --   4.5  4.2   MG  1.7  2.0  1.9  1.5*   PO4  4.1   --   2.6  3.6       Recent Labs      12/30/16   0635  12/30/16 0800  12/30/16   1535  12/31/16   0230  01/01/17   0559   WBC  10.0  9.0   --   7.2  7.2   HGB  9.9*  9.6*   --   9.0*  8.5*   HCT  29.2*  27.7*   --   27.0*  24.9*   PLTCT  133*  136*   --   108*  89*   INR  1.8*  1.9*  2.2*  2.0*  1.8*   PTT  35.0   --    --    --    --    AST   --   35   --   27  28   ALT   --   11   --   7  8   ALKPHOS   --   49   --   45  48      Estimated Creatinine Clearance: 67.6 mL/min (A) (based on SCr of 1.76 mg/dL (H)).  Vitals:    12/30/16 9604   Weight: 82.4 kg (181 lb 11.2 oz)    No results for input(s): PHART, PO2ART in the last 72 hours.    Invalid input(s): PC02A

## 2017-01-01 NOTE — Case Management (ED)
Case Management Progress Note    NAME:Tristan Briggs                          MRN: [id number]              DOB:1980/12/27          AGE: 36 y.o.  ADMISSION DATE: 12/30/2016             DAYS ADMITTED: LOS: 2 days      Todays Date: 01/01/2017    Plan  Discharge home today with wife.    Interventions  ? Support      ? Info or Referral      ? Discharge Planning    Supplies ordered and delivered from [employer] for patient's wound care.    Patient will be transported home by wife.  Medicaid is now pending.  ? Medication Needs      ? Financial      ? Legal      ? Other        Disposition  ? Expected Discharge Date    Expected Discharge Date: 01/01/17  ? Transportation   Does the patient need discharge transport arranged?: No  Transportation Name, Phone and Availability #1: [phone]  Does the patient use Medicaid Transportation?: No  ? Next Level of Care (Acute Psych discharges only)      ? Discharge Disposition                                          Durable Medical Equipment      No service has been selected for the patient.      [facility]      No service has been selected for the patient.      [facility]      No service has been selected for the patient.      [facility]      No service has been selected for the patient.          Cleophas Dunkeranielle Jack Bolio, RN, MSN  Nurse Case Manager  [phone]

## 2017-01-01 NOTE — Progress Notes
General Progress Note    Name:  Tristan Briggs   Today's Date:  01/01/2017  Admission Date: 12/30/2016  LOS: 2 days                     Assessment/Plan:    Active Problems:    Hepatic encephalopathy (HCC)    Alcoholic cirrhosis of liver with ascites (HCC)    Hepatorenal syndrome (HCC)    Hypokalemia    Moderate malnutrition (HCC)    Tristan Briggs is a 36 yr old male with past medical history of alcoholic cirrhosis complicated with ascites, hepatic encephalopathy presents to the [facility] as a transfer from outside facility for hepatic encephalopathy, acute kidney injury and abdominal distention.     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  MELD-Na score: 26 at 01/01/2017  5:59 AM  MELD score: 25 at 01/01/2017  5:59 AM  Calculated from:  Serum Creatinine: 1.76 MG/DL at 13/08/6576  4:69 AM  Serum Sodium: 134 MMOL/L at 01/01/2017  5:59 AM  Total Bilirubin: 3.5 MG/DL at 62/95/2841  3:24 AM  INR(ratio): 2 at 12/31/2016  2:30 AM  Age: 36 years  - not a transplant candidate sec to lack of lack of documented sobriety, patient is currently Medicaid pending  - patient assessed by Transplant SW, Nicanor Bake 12/27  - abd [place] 12/30/16, cirrhotic liver morphology, no discrete hepatic mass, no detectable blood flow within the main portal vein, which is presumably occluded, minimal flow suggested within the right & left portal veins, which may be due to the presence of small venous collaterals, portal hypertension with mild splenomegaly & large volume ascites, gallbladder sludge, mild, nonspecific gallbladder wall thickening is most likely related to underlying liver disease    Hepatic encephalopathy  - MS stable, no asterixis  - no evidence of active infection  - continue Zinc sulfate 220mg  daily   - continue Lactulose titrated to 3-4 BMs daily  - continue Rifaximin 550mg  BID on discharge - patient has been approved for assistance program  - please insure patient's discharge information includes instructions to limit Tylenol to no more than 2gm daily    Ascites  - requiring paracentesis every 7-10 days  - paracentesis 12/26, removed 8.9L fluid, no SBP, not sent for cultures  - orders have been entered for prn paracentesis through [facility], patient given information to schedule appointments as outpatient  - continue low sodium diet    Portal vein thrombosis  - as noted on abd [place] 12/26, no detectable blood flow within the main portal vein, which is presumably occluded, minimal flow suggested within the right & left portal veins, which may be due to the presence of small venous collaterals  - no plans for anticoagulation given h/o of esophageal variceal bleed requiring banding    Variceal screening  - h/o variceal bleeding s/p banding in 06/2016 at [facility] facility  - repeat EGD 11/2016 at [facility] [place], normal examined duodenum, moderate, diffuse portal hypertensive gastropathy in the gastric fundus and in the gastric body, three columns of non-bleeding grade 1 varices found in the lower third of the esophagus, small in size, no stigmata of recent bleeding, no red wale signs  - no evidence of active bleeding at this time    Colon screening  - per patient, has never had colonoscopy    AKI  - in setting of ESLD, HRS  - recently discharged from [facility] [place] on Midodrine 10mg  TID and Octreotide TID  per patient    - patient reports prn paracentesis through ED at hospital in [place], reports up to 10L removed but was not administered Albumin replacement with paracentesis   - unknown baseline Cr, Cr on admission 2.8  - Cr 1.76 <-- 1.86, UO 557ml/24hrs as recorded  - not on diuretics PTA given worsening renal function, no diuretics on discharge  - s/p 25% Albumin, serum albumin currently 4.2  - per Dr. Constance Goltz, recommend continuing PTA Midodrine 10mg  TID and remainder of PTA Octreotide patient has at home, on discharge  - accurate I&O  - Renal following    HCC screening - no discrete hepatic mass on abd [place] 12/26    FEN  - low sodium diet  - aggressive nutritional support, supplements    Disposition  - stable for discharge today from Hepatology standpoing  - FU with Dr. Constance Goltz in the Hepatology Clinic on 01/12/17 at 1120  - labs (CBC, CMP, INR) 1 week following discharge faxed to [phone]    Patient seen and discussed with Dr. Constance Goltz. Patient discussed with MP-G, Dr. Ladona Ridgel.   ________________________________________________________________________    Subjective  Tristan Briggs is a 36 y.o. male no new concerns overnight. Feels that abdominal fluid may be starting to reaccumulate. Denies fever, SOB, CP, dizziness, abd pain, n/v. Tolerating PO. Having appropriate BMs.     Medications  Scheduled Meds:    heparin (porcine) PF syringe 5,000 Units 5,000 Units Subcutaneous Q8H   lactulose oral solution 20 g 30 mL Oral Q6H   melatonin tablet 3 mg 3 mg Oral QHS   octreotide (SANDOSTATIN) injection 100 mcg 100 mcg Subcutaneous TID   rifAXIMin (XIFAXAN) tablet 550 mg 550 mg Oral BID   zinc sulfate capsule 220 mg 220 mg Oral QDAY   Continuous Infusions:  PRN and Respiratory Meds:acetaminophen Q6H PRN, magnesium sulfate PRN (On Call from Rx), milk of magnesia (CONC) Q6H PRN, ondansetron (ZOFRAN) IV Q6H PRN, potassium chloride SR PRN **OR** potassium chloride PRN    Objective:                          Vital Signs: Last Filed                 Vital Signs: 24 Hour Range   BP: 129/82 (12/28 0744)  Temp: 36.9 ???C (98.4 ???F) (12/28 0744)  Pulse: 68 (12/28 0744)  Respirations: 18 PER MINUTE (12/28 0744)  SpO2: 99 % (12/28 0744)  O2 Delivery: None (Room Air) (12/28 0744) BP: (105-129)/(58-86)   Temp:  [36.9 ???C (98.4 ???F)-37.2 ???C (98.9 ???F)]   Pulse:  [68-110]   Respirations:  [18 PER MINUTE]   SpO2:  [99 %-100 %]   O2 Delivery: None (Room Air)     Vitals:    12/30/16 0981   Weight: 82.4 kg (181 lb 11.2 oz)       Intake/Output Summary:  (Last 24 hours) Intake/Output Summary (Last 24 hours) at 01/01/2017 0859  Last data filed at 01/01/2017 0500  Gross per 24 hour   Intake 960 ml   Output 550 ml   Net 410 ml      Stool Occurrence: 1    Physical Exam  Gen: alert, oriented, in no distress, cachectic  HEENT: NCAT, + scleral icterus, poor dentition  Resp: CTAB, non labored at rest, RA  CV: RRR, no LE edema  Abd: soft, mildly distended, non tender, BS+  Skin: jaundiced, multiple tattoos  Ext: thin, muscle wasting  Neuro: non focal exam, no asterixis or tremor    Lab Review  24-hour labs:    Results for orders placed or performed during the hospital encounter of 12/30/16 (from the past 24 hour(s))   LACTIC ACID(LACTATE)    Collection Time: 12/31/16 11:25 AM   Result Value Ref Range    Lactic Acid 2.5 (H) 0.5 - 2.0 MMOL/L   POC GLUCOSE    Collection Time: 12/31/16 12:25 PM   Result Value Ref Range    Glucose, POC 151 (H) 70 - 100 MG/DL   LACTIC ACID(LACTATE)    Collection Time: 12/31/16  3:23 PM   Result Value Ref Range    Lactic Acid 2.6 (H) 0.5 - 2.0 MMOL/L   POC GLUCOSE    Collection Time: 12/31/16  4:44 PM   Result Value Ref Range    Glucose, POC 172 (H) 70 - 100 MG/DL   LACTIC ACID(LACTATE)    Collection Time: 12/31/16  9:00 PM   Result Value Ref Range    Lactic Acid 3.5 (H) 0.5 - 2.0 MMOL/L   LACTIC ACID(LACTATE)    Collection Time: 01/01/17 12:15 AM   Result Value Ref Range    Lactic Acid 2.1 (H) 0.5 - 2.0 MMOL/L   LACTIC ACID(LACTATE)    Collection Time: 01/01/17  5:59 AM   Result Value Ref Range    Lactic Acid 1.1 0.5 - 2.0 MMOL/L   CBC    Collection Time: 01/01/17  5:59 AM   Result Value Ref Range    White Blood Cells 7.2 4.5 - 11.0 K/UL    RBC 2.71 (L) 4.4 - 5.5 M/UL    Hemoglobin 8.5 (L) 13.5 - 16.5 GM/DL    Hematocrit 16.1 (L) 40 - 50 %    MCV 91.8 80 - 100 FL    MCH 31.4 26 - 34 PG    MCHC 34.2 32.0 - 36.0 G/DL    RDW 09.6 (H) 11 - 15 %    Platelet Count 89 (L) 150 - 400 K/UL    MPV 8.9 7 - 11 FL   COMPREHENSIVE METABOLIC PANEL Collection Time: 01/01/17  5:59 AM   Result Value Ref Range    Sodium 134 (L) 137 - 147 MMOL/L    Potassium 3.6 3.5 - 5.1 MMOL/L    Chloride 107 98 - 110 MMOL/L    Glucose 125 (H) 70 - 100 MG/DL    Blood Urea Nitrogen 44 (H) 7 - 25 MG/DL    Creatinine 0.45 (H) 0.4 - 1.24 MG/DL    Calcium 8.8 8.5 - 40.9 MG/DL    Total Protein 5.1 (L) 6.0 - 8.0 G/DL    Total Bilirubin 3.5 (H) 0.3 - 1.2 MG/DL    Albumin 4.2 3.5 - 5.0 G/DL    Alk Phosphatase 48 25 - 110 U/L    AST (SGOT) 28 7 - 40 U/L    CO2 17 (L) 21 - 30 MMOL/L    ALT (SGPT) 8 7 - 56 U/L    Anion Gap 10 3 - 12    eGFR Non African American 44 (L) >60 mL/min    eGFR African American 53 (L) >60 mL/min   MAGNESIUM    Collection Time: 01/01/17  5:59 AM   Result Value Ref Range    Magnesium 1.5 (L) 1.6 - 2.6 mg/dL   PHOSPHORUS    Collection Time: 01/01/17  5:59 AM   Result Value Ref Range    Phosphorus 3.6 2.0 - 4.5 MG/DL  IRON + BINDING CAPACITY + %SAT+ FERRITIN    Collection Time: 01/01/17  5:59 AM   Result Value Ref Range    Iron 85 50 - 185 MCG/DL    Iron Binding-TIBC 454 (L) 270 - 380 MCG/DL    % Saturation 72 (H) 28 - 42 %    Ferritin 417 (H) 30 - 300 NG/ML   VITAMIN B12    Collection Time: 01/01/17  5:59 AM   Result Value Ref Range    Vitamin B12 619 180 - 914 PG/ML   FOLATE, SERUM    Collection Time: 01/01/17  5:59 AM   Result Value Ref Range    Serum Folate 13.9 >3.9 NG/ML   RETICULOCYTE COUNT    Collection Time: 01/01/17  5:59 AM   Result Value Ref Range    Retic, Uncorrected 2.3 (H) 0.5 - 2.0 %    Retic, Corrected 1.3 %    Retic, Absolute 61.7 30 - 94 K/UL       Point of Care Testing  (Last 24 hours)  Glucose: (!) 125 (01/01/17 0559)  POC Glucose (Download): (!) 172 (12/31/16 1644)    Radiology and other Diagnostics Review:    Pertinent radiology reviewed.    Silvio Clayman, APRN   Pager [phone]

## 2017-01-02 NOTE — Progress Notes
Discharge day note:    Patient seen and examined on rounds today.  Mental status is significantly improved.  Patient is completely alert and oriented.  No acute complaints.  Labs and vitals are stable.    Discussed with GI and renal.  They are okay with patient going home today.   hepatology recommended continuation of octreotide and Midrin.  They will follow-up labs in 1 week.  Patient approved for rifaximin patient assistance.  Provided with samples of discharge from patient will receive remaining prescription via mail order.  Hepatology arrange for weekly paracenteses.  Follow-up with hepatology on January 8.    Greater than 30 minutes was spent in discharge planning including discussion with patient, discussion with hepatology and renal, discussion with case management, documentation of discharge summary, placement of discharge orders.    Crista ElliotMelissa E Taylor, MD   Med Private G- [phone]

## 2017-01-03 ENCOUNTER — Encounter: Admit: 2017-01-03 | Discharge: 2017-01-03

## 2017-01-03 DIAGNOSIS — F1020 Alcohol dependence, uncomplicated: ICD-10-CM

## 2017-01-03 DIAGNOSIS — K7460 Unspecified cirrhosis of liver: Principal | ICD-10-CM

## 2017-01-03 NOTE — Discharge Instructions - Pharmacy
Physician Discharge Summary      Name: Tristan Briggs  Medical Record Number: [id number]        Account Number:  [id number]  Date Of Birth:  Dec 23, 1980                         Age:  36 years   Admit date:  12/30/2016                     Discharge date:  01/01/2017    Attending Physician:  Crista Elliot, MD                Service: Med Private [phone]    Physician Summary completed by: Crista Elliot, MD    Reason for hospitalization: hepatic encephalopathy     Significant PMH:   Past Medical History:   Diagnosis Date   ??? Alcoholism (HCC)    ??? Cirrhosis (HCC)          Allergies: Patient has no known allergies.    Admission Physical Exam notable for:    Constitutional: He appears well-developed. He appears lethargic. He appears cachectic.   HENT:   Head: Normocephalic and atraumatic.   Eyes: Scleral icterus is present.   Neck: Neck supple.   Cardiovascular: Normal rate and regular rhythm.   Pulmonary/Chest: Effort normal and breath sounds normal.   Abdominal: Soft. Bowel sounds are normal. He exhibits distension. There is no tenderness.   Musculoskeletal: Normal range of motion.   Neurological: He appears lethargic.   Skin: Skin is dry.   Psychiatric: His affect is labile. His speech is slurred.       Admission Lab/Radiology studies notable for:   WBC 10.0, Hgb 9.9, Plt 133, MCV 90.8, RDW 20.6  INR 1.8  Na 138, K 2.8, Cl 105, CO2 16, AG 17, Cr 2.75, BUN 54, Glu 192, Tbili 4.1, Phos 4.1, AST 35, ALT 11, ALP 49  Lactic acid 5.1  Etoh <10  APAP neg   Acute hep panel neg   UA nl   Peritoneal fluid - neg for SBP     Folate nl  B12 nl  Iron penal - anemia of chronic disease, ferritin 417    Blood cultures neg to date    Ethyl glucuronide screen - for alcohol use, can detect up to 80 hours after ingestion - negative      [place] abd with doppler  1. ???Cirrhotic liver morphology. There is no discrete hepatic mass.  2. ???No detectable blood flow within the main portal vein, which is presumably occluded. There is minimal flow suggested within the right and   left portal veins, which may be due to the presence of small venous   collaterals.  3. ???Portal hypertension, with mild splenomegaly and large volume ascites.  4. ???Gallbladder sludge. Mild, nonspecific gallbladder wall thickening is   most likely related to underlying liver disease.        Brief Hospital Course:  The patient was admitted and the following issues were addressed during this hospitalization: (with pertinent details).      Patient is a 36 yo M with pmh of alcoholism and development of cirrhosis. Wife reports he was drinking a fifth of whiskey for several years prior to becoming sick but that he has been a long-time drinker before that. Has been diagnosed with cirrhosis in [place] and receiving weekly paracentesis due to symptoms. They recently moved to [place] to be close  to the liver clinic here to be evaluated for transplant.     Patient presented with several days of confusion consistent with hepatic encephalopathy. Patient was nauseous and vomiting. Infectious work up was negative including investigation for SBP.  He did have significant hypokalemia which was corrected. He was started on aggressive lactulose and rifaximin. Encephalopathy quickly resolved.  He did have ~8L removed during his paracentesis but was already starting to feel full again before discharge.     Hepatology was consulted and assisted in setting up rifaximin assistance program and follow up in clinic. Also arranged weekly paracentesis with IR.     Patient was approved for medicaid-pending status prior to discharge.     Also, patient was found to have AKI on CKD. Concern for hepatorenal syndrome. Renal was consulted. He was started on octreotide. Was already on midodrine PTA. Cr improved. Hepatology wanted to continue him on midodrine and octreotide on discharge.     Patient to follow up with hepatology in early January. Referred to outpatient internal med for primary care.       Condition at Discharge: Stable    Discharge Diagnoses:      Hospital Problems        Active Problems    Alcoholic cirrhosis of liver with ascites (HCC)    Hepatorenal syndrome (HCC)    Secondary esophageal varices without bleeding (HCC)    Ascites    AKI (acute kidney injury) (HCC)    CKD (chronic kidney disease) stage 2, GFR 60-89 ml/min    Portal vein thrombosis    Decubitus ulcer of sacral region, stage 3 (HCC)    Moderate protein-calorie malnutrition (HCC)    Normocytic anemia    Anemia of chronic disease    Thrombocytopenia (HCC)       Resolved Problems    * (Principal) RESOLVED: Decompensated hepatic cirrhosis (HCC)    RESOLVED: Hepatic encephalopathy (HCC)    RESOLVED: Hypokalemia    RESOLVED: Lactic acidosis          Surgical Procedures: None    Significant Diagnostic Studies and Procedures: noted in brief hospital course    Consults:  Hepatology and Nephrology    Patient Disposition: Home       Patient instructions/medications:      Activity as Tolerated    It is important to keep increasing your activity level after you leave the hospital.  Moving around can help prevent blood clots, lung infection (pneumonia) and other problems.  Gradually increasing the number of times you are up moving around will help you return to your normal activity level more quickly.  Continue to increase the number of times you are up to the chair and walking daily to return to your normal activity level. Begin to work toward your normal activity level at discharge     Report These Signs and Symptoms    Please contact your doctor if you have any of the following symptoms: temperature higher than 100 degrees F, uncontrolled pain, persistent nausea and/or vomiting, difficulty breathing, chest pain, severe abdominal pain, unable to urinate or unable to have bowel movement.     Questions About Your Stay For questions or concerns regarding your hospital stay. Call [phone]     Discharging attending physician: Crista Elliot [[id number]]      Low Sodium Diet    You will need to monitor the amount of sodium in your diet. Do not eat more than 2g (grams) or 2000mg  (milligrams) per day.  This will  help significantly to control your ascites.     If you have questions regarding your diet at home, you may contact a dietitian at [phone].     General Supplement    Please eat a high protein diet.     Other Information    You have a follow up appointment with hepatology on the 8th. You will need labs drawn prior to that visit at the outpatient lab in the medical office building located on the first floor of the [facility] which is on the southeast corner of campus. You can have labs drawn the same day before your visit.      Current Discharge Medication List       START taking these medications    Details   melatonin 3 mg tab Take one tablet by mouth at bedtime daily.  Qty: 30 tablet, Refills: 3    PRESCRIPTION TYPE:  Normal      ondansetron (ZOFRAN) 4 mg tablet Take one tablet by mouth every 8 hours as needed for Nausea or Vomiting.  Qty: 20 tablet, Refills: 0    PRESCRIPTION TYPE:  Normal      rifAXIMin (XIFAXAN) 550 mg tablet Take one tablet by mouth twice daily.  Qty: 60 tablet, Refills: 3    PRESCRIPTION TYPE:  Normal  Comments: Prescription for Patient Assistance Program.      zinc sulfate 220 mg (50 mg elemental zinc) capsule Take one capsule by mouth daily.  Qty: 30 capsule, Refills: 3    PRESCRIPTION TYPE:  Normal          CONTINUE these medications which have been CHANGED or REFILLED    Details   octreotide acetate 100 mcg/mL (1 mL) syrg Inject 100 mcg under the skin three times daily. (100mg  =73mL)  Qty: 20 mL, Refills: 3    PRESCRIPTION TYPE:  Normal          CONTINUE these medications which have NOT CHANGED    Details   lactulose 10 gram/15 mL oral solution Take 20 g by mouth three times daily. PRESCRIPTION TYPE:  Historical Med      midodrine(+) (PROAMITINE) 10 mg tablet Take 10 mg by mouth three times daily.    PRESCRIPTION TYPE:  Historical Med              Scheduled appointments:    Mar 29, 2017  8:40 AM CDT  New Patient with Jorje Guild, MD  [facility]) [facility] Level 4B  [address]  [place] [address]  [phone]    Additional appointment instructions:      Please contact the clinic and schedule a follow up appointment.    [facility]   5.0 (5) ??? Medical clinic   [address] [place]   [phone]       Visitor Information  What to Bring  Photo ID   Proof of Residency: A bill in your name sent to your current address, a bank statement in your name sent to your current address   Proof of income (if uninsured and interested in sliding-scale fee) OR insurance card   If uninsured, please bring one of these: most recent tax return or two most recent pay stubs for each adult in your household.                          Pending items needing  follow up:   1. Blood cultures to final negative     Signed:  Crista Elliot, MD  01/03/2017      cc:  Primary Care Physician:  No Pcp, Na   No PCP  Referring physicians:  [place], PA-C   Additional provider(s): Jorje Guild

## 2017-01-04 ENCOUNTER — Encounter: Admit: 2017-01-04 | Discharge: 2017-01-04

## 2017-01-05 LAB — CULTURE-BLOOD W/SENSITIVITY

## 2017-01-06 ENCOUNTER — Encounter: Admit: 2017-01-06 | Discharge: 2017-01-06

## 2017-01-07 ENCOUNTER — Encounter: Admit: 2017-01-07 | Discharge: 2017-01-07

## 2017-01-12 ENCOUNTER — Ambulatory Visit: Admit: 2017-01-12 | Discharge: 2017-01-12 | Payer: MEDICAID

## 2017-01-12 ENCOUNTER — Encounter: Admit: 2017-01-12 | Discharge: 2017-01-12

## 2017-01-12 ENCOUNTER — Ambulatory Visit: Admit: 2017-01-12 | Discharge: 2017-01-12

## 2017-01-12 DIAGNOSIS — K7031 Alcoholic cirrhosis of liver with ascites: Principal | ICD-10-CM

## 2017-01-12 DIAGNOSIS — K7290 Hepatic failure, unspecified without coma: ICD-10-CM

## 2017-01-12 DIAGNOSIS — K7460 Unspecified cirrhosis of liver: Principal | ICD-10-CM

## 2017-01-12 DIAGNOSIS — F1020 Alcohol dependence, uncomplicated: ICD-10-CM

## 2017-01-12 LAB — ALPHA FETO PROTEIN (AFP): Lab: 5.7 ng/mL (ref 0.0–15.0)

## 2017-01-12 LAB — COMPREHENSIVE METABOLIC PANEL
Lab: 11 (ref 3–12)
Lab: 11 U/L (ref 7–56)
Lab: 134 MMOL/L — ABNORMAL LOW (ref 137–147)
Lab: 151 mg/dL — ABNORMAL HIGH (ref 70–100)
Lab: 16 MMOL/L — ABNORMAL LOW (ref 21–30)
Lab: 2.4 mg/dL — ABNORMAL HIGH (ref 0.4–1.24)
Lab: 2.6 mg/dL — ABNORMAL HIGH (ref 0.3–1.2)
Lab: 3.9 MMOL/L (ref 3.5–5.1)
Lab: 30 mL/min — ABNORMAL LOW (ref >60)
Lab: 34 mg/dL — ABNORMAL HIGH (ref 7–25)
Lab: 36 mL/min — ABNORMAL LOW (ref >60)
Lab: 4.6 g/dL (ref 3.5–5.0)
Lab: 5.9 g/dL — ABNORMAL LOW (ref 6.0–8.0)
Lab: 58 U/L — ABNORMAL HIGH (ref 7–40)
Lab: 9 mg/dL (ref 8.5–10.6)

## 2017-01-12 LAB — CBC AND DIFF
Lab: 0 % (ref 0–2)
Lab: 0 10*3/uL (ref 0–0.20)
Lab: 0.4 10*3/uL (ref 0–0.45)
Lab: 0.4 10*3/uL (ref 0–0.80)
Lab: 1.3 10*3/uL (ref 1.0–4.8)
Lab: 118 10*3/uL — ABNORMAL LOW (ref 150–400)
Lab: 2.7 M/UL — ABNORMAL LOW (ref 4.4–5.5)
Lab: 25 % — ABNORMAL LOW (ref 40–50)
Lab: 3.6 10*3/uL (ref 1.8–7.0)
Lab: 32 pg (ref 26–34)
Lab: 34 g/dL (ref 32.0–36.0)
Lab: 5.8 10*3/uL (ref 4.5–11.0)
Lab: 61 % (ref 41–77)
Lab: 8 % (ref 4–12)
Lab: 8 % — ABNORMAL HIGH (ref 0–5)
Lab: 9 g/dL — ABNORMAL LOW (ref 13.5–16.5)
Lab: 94 FL (ref 80–100)

## 2017-01-12 LAB — 25-OH VITAMIN D (D2 + D3): Lab: 4.6 ng/mL — ABNORMAL LOW (ref 30–80)

## 2017-01-12 LAB — PROTIME INR (PT): Lab: 1.7 U/L — ABNORMAL HIGH (ref 0.8–1.2)

## 2017-01-12 MED ORDER — ONDANSETRON HCL 4 MG PO TAB
4 mg | ORAL_TABLET | ORAL | 2 refills | 8.00000 days | Status: AC | PRN
Start: 2017-01-12 — End: 2018-05-19

## 2017-01-12 MED ORDER — MIDODRINE 10 MG PO TAB
10 mg | ORAL_TABLET | Freq: Three times a day (TID) | ORAL | 1 refills | Status: AC
Start: 2017-01-12 — End: 2017-01-18

## 2017-01-13 ENCOUNTER — Encounter: Admit: 2017-01-13 | Discharge: 2017-01-13

## 2017-01-13 DIAGNOSIS — I8500 Esophageal varices without bleeding: Principal | ICD-10-CM

## 2017-01-13 LAB — CELL COUNT W/DIFF-FLUIDS
Lab: 1 %
Lab: 26 %
Lab: 38 /uL
Lab: 73 %
Lab: <10 /uL

## 2017-01-13 MED ORDER — SODIUM CHLORIDE 0.9 % IV SOLP
INTRAVENOUS | 0 refills | Status: CN
Start: 2017-01-13 — End: ?

## 2017-01-14 ENCOUNTER — Ambulatory Visit: Admit: 2017-01-14 | Discharge: 2017-01-14

## 2017-01-15 ENCOUNTER — Encounter: Admit: 2017-01-15 | Discharge: 2017-01-15

## 2017-01-15 DIAGNOSIS — R69 Illness, unspecified: Principal | ICD-10-CM

## 2017-01-15 LAB — ETHYL GLUCURONIDE SCREEN WITH REFLEX, URINE: Lab: NEGATIVE

## 2017-01-15 LAB — VITAMIN A

## 2017-01-18 ENCOUNTER — Encounter: Admit: 2017-01-18 | Discharge: 2017-01-18

## 2017-01-18 DIAGNOSIS — K7031 Alcoholic cirrhosis of liver with ascites: Principal | ICD-10-CM

## 2017-01-18 MED ORDER — MIDODRINE 10 MG PO TAB
10 mg | ORAL_TABLET | Freq: Three times a day (TID) | ORAL | 0 refills | Status: AC
Start: 2017-01-18 — End: 2017-06-01
  Filled 2017-01-18 (×2): qty 90, 30d supply, fill #1

## 2017-01-18 MED ORDER — VITAMIN A 10,000 UNIT PO CAP
10000 [IU] | ORAL_CAPSULE | Freq: Every day | ORAL | 3 refills | Status: AC
Start: 2017-01-18 — End: 2017-03-18

## 2017-01-18 MED ORDER — CHOLECALCIFEROL (VITAMIN D3) 50,000 UNIT PO CAP
50000 [IU] | ORAL_CAPSULE | ORAL | 0 refills | 84.00000 days | Status: AC
Start: 2017-01-18 — End: 2017-03-18

## 2017-01-19 ENCOUNTER — Encounter: Admit: 2017-01-19 | Discharge: 2017-01-19

## 2017-01-20 ENCOUNTER — Encounter: Admit: 2017-01-20 | Discharge: 2017-01-20

## 2017-01-20 DIAGNOSIS — K7031 Alcoholic cirrhosis of liver with ascites: Principal | ICD-10-CM

## 2017-01-26 ENCOUNTER — Encounter: Admit: 2017-01-26 | Discharge: 2017-01-26

## 2017-01-26 DIAGNOSIS — K7460 Unspecified cirrhosis of liver: Principal | ICD-10-CM

## 2017-01-26 DIAGNOSIS — F1020 Alcohol dependence, uncomplicated: ICD-10-CM

## 2017-01-27 ENCOUNTER — Encounter: Admit: 2017-01-27 | Discharge: 2017-01-27

## 2017-01-27 DIAGNOSIS — K7031 Alcoholic cirrhosis of liver with ascites: Principal | ICD-10-CM

## 2017-01-28 ENCOUNTER — Encounter: Admit: 2017-01-28 | Discharge: 2017-01-28

## 2017-01-28 DIAGNOSIS — K7031 Alcoholic cirrhosis of liver with ascites: Principal | ICD-10-CM

## 2017-01-29 ENCOUNTER — Encounter: Admit: 2017-01-29 | Discharge: 2017-01-29

## 2017-02-01 ENCOUNTER — Encounter: Admit: 2017-02-01 | Discharge: 2017-02-01

## 2017-02-01 DIAGNOSIS — K7031 Alcoholic cirrhosis of liver with ascites: Principal | ICD-10-CM

## 2017-02-01 DIAGNOSIS — Z01818 Encounter for other preprocedural examination: Principal | ICD-10-CM

## 2017-02-01 LAB — COMPREHENSIVE METABOLIC PANEL
Lab: 127 mg/dL — ABNORMAL HIGH (ref 70–105)
Lab: 134 MMOL/L — ABNORMAL LOW (ref 136–145)
Lab: 16 mL/min/{1.73_m2} — ABNORMAL LOW (ref >59)
Lab: 4.1 MMOL/L (ref 3.5–5.1)
Lab: 4.3 g/dL (ref 3.5–5.0)
Lab: 4.4 mg/dL — ABNORMAL HIGH (ref 0.72–1.25)
Lab: 5.9 g/dL — ABNORMAL LOW (ref 6.4–8.3)
Lab: 51 mg/dL — ABNORMAL HIGH (ref 8.9–20.6)
Lab: 55 U/L — ABNORMAL HIGH (ref 5–34)
Lab: 9 mg/dL (ref 8.4–10.2)

## 2017-02-01 LAB — PROTIME INR (PT)
Lab: 1.6
Lab: 17 s — ABNORMAL HIGH (ref 9.9–12.1)

## 2017-02-02 ENCOUNTER — Encounter: Admit: 2017-02-02 | Discharge: 2017-02-02

## 2017-02-03 ENCOUNTER — Encounter: Admit: 2017-02-03 | Discharge: 2017-02-03

## 2017-02-03 DIAGNOSIS — Z01818 Encounter for other preprocedural examination: Principal | ICD-10-CM

## 2017-02-05 ENCOUNTER — Encounter: Admit: 2017-02-05 | Discharge: 2017-02-05

## 2017-02-08 ENCOUNTER — Encounter: Admit: 2017-02-08 | Discharge: 2017-02-08

## 2017-02-08 DIAGNOSIS — K7031 Alcoholic cirrhosis of liver with ascites: Principal | ICD-10-CM

## 2017-02-08 LAB — CBC AND DIFF
Lab: 1.1 — ABNORMAL HIGH (ref 0.0–0.6)
Lab: 0.2 10*3/uL (ref 0.0–0.2)
Lab: 1 10*3/uL — ABNORMAL HIGH (ref 0.1–0.9)
Lab: 101 FL — ABNORMAL HIGH (ref 80.0–99.0)
Lab: 11 % — ABNORMAL HIGH (ref 0.0–6.0)
Lab: 163 10*3/uL (ref 130–400)
Lab: 17 MMOL/L — ABNORMAL HIGH (ref 11.5–14.5)
Lab: 2.2 % — ABNORMAL HIGH (ref 0.0–2.0)
Lab: 2.2 /uL (ref 0.9–5.1)
Lab: 21 % — ABNORMAL LOW (ref 18.0–47.0)
Lab: 3 U/L — ABNORMAL LOW (ref 4.70–6.10)
Lab: 30 % — ABNORMAL LOW (ref 42.0–52.0)
Lab: 31 g/dL — ABNORMAL LOW (ref 33.0–37.0)
Lab: 32 g/dL — ABNORMAL HIGH (ref 27.0–31.0)
Lab: 5.6 10*3/uL (ref 1.9–8.1)
Lab: 55 % (ref 40.0–75.0)
Lab: 9.6 g/dL — ABNORMAL LOW (ref 14.0–18.0)
Lab: 9.7 % — ABNORMAL HIGH (ref 0.0–10)

## 2017-02-08 LAB — PROTIME INR (PT)
Lab: 1.9 X10-3/UL — ABNORMAL HIGH (ref 4.8–10.8)
Lab: 20 s — ABNORMAL HIGH (ref 9.9–12.1)

## 2017-02-08 LAB — COMPREHENSIVE METABOLIC PANEL
Lab: 143 mg/dL — ABNORMAL HIGH (ref 70–105)
Lab: 40 mg/dL — ABNORMAL HIGH (ref 8.9–20)

## 2017-02-09 ENCOUNTER — Encounter: Admit: 2017-02-09 | Discharge: 2017-02-09

## 2017-02-10 ENCOUNTER — Encounter: Admit: 2017-02-10 | Discharge: 2017-02-10

## 2017-02-11 ENCOUNTER — Encounter: Admit: 2017-02-11 | Discharge: 2017-02-11

## 2017-02-12 ENCOUNTER — Encounter: Admit: 2017-02-12 | Discharge: 2017-02-12

## 2017-02-12 DIAGNOSIS — K7031 Alcoholic cirrhosis of liver with ascites: Principal | ICD-10-CM

## 2017-02-12 LAB — CBC AND DIFF
Lab: 0.3 10*3/uL — ABNORMAL HIGH (ref 0.0–0.2)
Lab: 0.8 10*3/uL (ref 0.1–0.9)
Lab: 1.2 10*3/uL — ABNORMAL HIGH (ref 0.0–0.6)
Lab: 1.7 10*3/uL (ref 0.9–5.1)
Lab: 12 % — ABNORMAL HIGH (ref 0.0–6.0)
Lab: 177 X10-3/UL (ref 130–400)
Lab: 18 % (ref 18.0–47.0)
Lab: 3.1 % — ABNORMAL HIGH (ref 0.0–2.0)
Lab: 3.1 g/dL (ref 3.5–5.0)
Lab: 31 % — ABNORMAL LOW (ref 42.0–52.0)
Lab: 31 pg — ABNORMAL HIGH (ref 27.0–31.0)
Lab: 32 g/dL — ABNORMAL LOW (ref 33.0–37.0)
Lab: 5.4 10*3/uL (ref 1.9–8.1)
Lab: 57 % (ref 40.0–75.0)
Lab: 8.6 % (ref 0.0–10.0)
Lab: 9.4 X10-3/UL — ABNORMAL LOW (ref 4.8–10.8)
Lab: 98 FL (ref 80.0–99.0)

## 2017-02-12 LAB — COMPREHENSIVE METABOLIC PANEL
Lab: 1.8 mg/dL — ABNORMAL HIGH (ref 0.0–1.0)
Lab: 185 mg/dL — ABNORMAL HIGH (ref 70–105)
Lab: 209 IU/L — ABNORMAL HIGH (ref 40–150)
Lab: 22 mL/min/{1.73_m2} — ABNORMAL LOW (ref >59)
Lab: 29 U/L (ref 0–55)
Lab: 3.3 mg/dL — ABNORMAL HIGH (ref 0.72–1.25)
Lab: 52 mg/dL — ABNORMAL HIGH (ref 8.9–20.6)
Lab: 59 U/L — ABNORMAL HIGH (ref 5–34)
Lab: 9.1 mg/dL — ABNORMAL LOW (ref 8.4–10.2)

## 2017-02-12 LAB — PROTIME INR (PT): Lab: 17 s — ABNORMAL HIGH (ref 9.9–12.1)

## 2017-02-15 ENCOUNTER — Encounter: Admit: 2017-02-15 | Discharge: 2017-02-15

## 2017-02-18 ENCOUNTER — Encounter: Admit: 2017-02-18 | Discharge: 2017-02-18

## 2017-02-18 DIAGNOSIS — K7031 Alcoholic cirrhosis of liver with ascites: Principal | ICD-10-CM

## 2017-02-18 DIAGNOSIS — K7290 Hepatic failure, unspecified without coma: ICD-10-CM

## 2017-02-23 ENCOUNTER — Encounter: Admit: 2017-02-23 | Discharge: 2017-02-23

## 2017-03-01 ENCOUNTER — Encounter: Admit: 2017-03-01 | Discharge: 2017-03-01

## 2017-03-01 DIAGNOSIS — K7031 Alcoholic cirrhosis of liver with ascites: Principal | ICD-10-CM

## 2017-03-01 LAB — COMPREHENSIVE METABOLIC PANEL
Lab: 106 MMOL/L (ref 98–107)
Lab: 12 MMOL/L — ABNORMAL LOW (ref 22–29)
Lab: 130 % — ABNORMAL LOW (ref 136–145)
Lab: 16 /uL — ABNORMAL HIGH (ref 10–14)
Lab: 167 IU/L — ABNORMAL HIGH (ref 40–150)
Lab: 186 mg/dL — ABNORMAL HIGH (ref 70–105)
Lab: 2.5 mg/dL — ABNORMAL HIGH (ref 0.0–1.0)
Lab: 20 U/L — ABNORMAL LOW (ref 0–55)
Lab: 26 FL — ABNORMAL LOW (ref >59)
Lab: 3.7 % — ABNORMAL LOW (ref 3.5–5.0)
Lab: 3.9 MMOL/L
Lab: 32 U/L — ABNORMAL HIGH (ref 15–34)
Lab: 45 mg/dL — ABNORMAL HIGH (ref 8.9–20.6)
Lab: 5.3 g/dL — ABNORMAL LOW (ref 6.4–8.3)
Lab: 8.6 mg/dL — ABNORMAL HIGH (ref 8.4–10.2)

## 2017-03-01 LAB — PROTIME INR (PT)
Lab: 15 s — ABNORMAL HIGH (ref 9.9–12.1)
Lab: 17 s — ABNORMAL HIGH (ref 9.9–12.1)

## 2017-03-01 LAB — CBC AND DIFF
Lab: 0.3 10*3/uL — ABNORMAL HIGH (ref 0.0–0.2)
Lab: 11 10*3/uL — ABNORMAL HIGH (ref 4.8–10.8)

## 2017-03-05 ENCOUNTER — Encounter: Admit: 2017-03-05 | Discharge: 2017-03-05

## 2017-03-10 ENCOUNTER — Encounter: Admit: 2017-03-10 | Discharge: 2017-03-10

## 2017-03-11 ENCOUNTER — Encounter: Admit: 2017-03-11 | Discharge: 2017-03-11

## 2017-03-15 ENCOUNTER — Ambulatory Visit: Admit: 2017-03-15 | Discharge: 2017-03-15 | Payer: MEDICAID

## 2017-03-15 ENCOUNTER — Encounter: Admit: 2017-03-15 | Discharge: 2017-03-15

## 2017-03-15 ENCOUNTER — Ambulatory Visit: Admit: 2017-03-15 | Discharge: 2017-03-15

## 2017-03-15 DIAGNOSIS — Z01818 Encounter for other preprocedural examination: Principal | ICD-10-CM

## 2017-03-15 DIAGNOSIS — K7031 Alcoholic cirrhosis of liver with ascites: ICD-10-CM

## 2017-03-15 LAB — PROTEIN/CR RATIO,UR RAN
Lab: 125 mg/dL
Lab: 9 mg/dL
Lab: 0.1

## 2017-03-15 LAB — TOTAL THYROXINE T4: Lab: 4.8 ug/dL — ABNORMAL LOW (ref 6.1–12.3)

## 2017-03-15 LAB — CREATINE KINASE-CPK: Lab: 56 U/L (ref 35–232)

## 2017-03-15 LAB — COMPREHENSIVE METABOLIC PANEL
Lab: 1.8 mg/dL — ABNORMAL HIGH (ref 0.4–1.24)
Lab: 126 mg/dL — ABNORMAL HIGH (ref 70–100)
Lab: 134 MMOL/L — ABNORMAL LOW (ref 137–147)
Lab: 4.1 MMOL/L (ref 3.5–5.1)

## 2017-03-15 LAB — SYPHILIS AB SCREEN: Lab: NEGATIVE

## 2017-03-15 LAB — EPSTEIN BARR  PANEL(EBV)
Lab: POSITIVE
Lab: POSITIVE

## 2017-03-15 LAB — PSA SCREEN: Lab: 0.1 ng/mL (ref <1.51)

## 2017-03-15 LAB — URINALYSIS DIPSTICK
Lab: NEGATIVE
Lab: NEGATIVE
Lab: NEGATIVE

## 2017-03-15 LAB — C3 COMPLEMENT 3: Lab: 76 mg/dL — ABNORMAL LOW (ref 88–200)

## 2017-03-15 LAB — CBC AND DIFF
Lab: 0.1 10*3/uL (ref 0–0.20)
Lab: 0.6 10*3/uL (ref 0–0.80)
Lab: 0.7 10*3/uL — ABNORMAL HIGH (ref 0–0.45)
Lab: 0.8 10*3/uL — ABNORMAL LOW (ref 1.0–4.8)
Lab: 135 10*3/uL — ABNORMAL LOW (ref 150–400)
Lab: 16 % — ABNORMAL HIGH (ref 11–15)
Lab: 17 % — ABNORMAL HIGH (ref 0–5)
Lab: 17 % — ABNORMAL LOW (ref 24–44)
Lab: 2.2 10*3/uL (ref 1.8–7.0)
Lab: 2.6 M/UL — ABNORMAL LOW (ref 4.4–5.5)
Lab: 24 % — ABNORMAL LOW (ref 40–50)
Lab: 3 % — ABNORMAL HIGH (ref 0–2)
Lab: 33 pg (ref 26–34)
Lab: 4.4 K/UL — ABNORMAL LOW (ref 4.5–11.0)
Lab: 7.7 FL — ABNORMAL LOW (ref >60)

## 2017-03-15 LAB — HIV 1& 2 AG-AB SCRN W REFLEX HIV 1 PCR QUANT: Lab: NEGATIVE

## 2017-03-15 LAB — LIPASE: Lab: 52 U/L (ref 11–82)

## 2017-03-15 LAB — HEPATITIS A IGG: Lab: NEGATIVE

## 2017-03-15 LAB — URINALYSIS, MICROSCOPIC

## 2017-03-15 LAB — LIPID PROFILE
Lab: 52 mg/dL (ref <100)
Lab: 64 mg/dL (ref <150)
Lab: 95 mg/dL (ref <200)

## 2017-03-15 LAB — ALCOHOL LEVEL: Lab: <10 mg/dL

## 2017-03-15 LAB — TSH WITH FREE T4 REFLEX: Lab: 3.3 uU/mL (ref 0.35–5.00)

## 2017-03-15 LAB — HEPATITIS C ANTIBODY W REFLEX HCV PCR QUANT: Lab: NEGATIVE

## 2017-03-15 LAB — HEPATITIS B SURFACE AB

## 2017-03-15 LAB — PARATHYROID HORMONE: Lab: 89 pg/mL — ABNORMAL HIGH (ref 10–65)

## 2017-03-15 LAB — VARICELLA ZOSTER AB IGG: Lab: POSITIVE

## 2017-03-15 LAB — GGTP: Lab: 29 U/L (ref 9–64)

## 2017-03-15 LAB — IRON + BINDING CAPACITY + %SAT+ FERRITIN
Lab: 215 ug/dL — ABNORMAL LOW (ref 270–380)
Lab: 84 ug/dL (ref 50–185)

## 2017-03-15 LAB — ANTI-NUCLEAR ANTIBODY(ANA): Lab: <80 {titer} (ref <80)

## 2017-03-15 LAB — ELECTROPHORESIS-SERUM PROTEIN
Lab: 4.9 g/dL — ABNORMAL LOW (ref 6.0–8.0)
Lab: 70 % — ABNORMAL HIGH (ref 48–68)

## 2017-03-15 LAB — HEMOGLOBIN A1C: Lab: 5.8 % (ref 4.0–6.0)

## 2017-03-15 LAB — PHOSPHORUS: Lab: 3.9 mg/dL (ref 2.0–4.5)

## 2017-03-15 LAB — CMV AB IGM: Lab: NEGATIVE MMOL/L — ABNORMAL HIGH (ref 98–110)

## 2017-03-15 LAB — 25-OH VITAMIN D (D2 + D3)

## 2017-03-15 LAB — HEPATITIS B CORE AB TOT (IGG+IGM): Lab: NEGATIVE

## 2017-03-15 LAB — COTININE, URINE (NICOTINE METABOLITE): Lab: NEGATIVE

## 2017-03-15 LAB — URIC ACID: Lab: 6.6 mg/dL (ref 4.0–8.0)

## 2017-03-15 LAB — PHENCYCLIDINES-URINE RANDOM: Lab: NEGATIVE

## 2017-03-15 LAB — TRANSFERRIN: Lab: 145 mg/dL — ABNORMAL LOW (ref 185–336)

## 2017-03-15 LAB — C REACTIVE PROTEIN (CRP): Lab: 0.3 mg/dL (ref <1.0)

## 2017-03-15 LAB — AMMONIA: Lab: 88 umol/L — ABNORMAL HIGH (ref 9–35)

## 2017-03-15 LAB — RUBELLA AB IGG

## 2017-03-15 LAB — MAGNESIUM: Lab: 1.7 mg/dL (ref 1.6–2.6)

## 2017-03-15 LAB — HEPATITIS B SURFACE AG: Lab: NEGATIVE

## 2017-03-15 LAB — COCAINE-URINE RANDOM: Lab: NEGATIVE

## 2017-03-15 LAB — BARBITURATES-URINE RANDOM: Lab: NEGATIVE

## 2017-03-15 LAB — CMV AB IGG: Lab: NEGATIVE mg/dL — ABNORMAL HIGH (ref 7–25)

## 2017-03-15 LAB — CANNABINOIDS-URINE RANDOM: Lab: NEGATIVE

## 2017-03-15 LAB — HEPATITIS A IGM: Lab: NEGATIVE

## 2017-03-15 LAB — LDH-LACTATE DEHYDROGENASE: Lab: 187 U/L — ABNORMAL LOW (ref >40)

## 2017-03-15 LAB — HERPES SIMPLEX IGG AB (HSV IGG)
Lab: NEGATIVE ng/mL — ABNORMAL HIGH (ref 30–300)
Lab: POSITIVE % — AB (ref 28–42)

## 2017-03-15 LAB — BENZODIAZEPINES-URINE RANDOM: Lab: NEGATIVE

## 2017-03-15 LAB — RHEUMATOID FACTOR (RF): Lab: <20 [IU]/mL (ref <24)

## 2017-03-15 LAB — AMYLASE: Lab: 55 U/L — ABNORMAL LOW (ref 24–100)

## 2017-03-15 LAB — ALPHA FETO PROTEIN (AFP): Lab: 3.9 ng/mL — ABNORMAL LOW (ref 0.0–15.0)

## 2017-03-15 LAB — PROTIME INR (PT): Lab: 1.3 — ABNORMAL HIGH (ref 0.8–1.2)

## 2017-03-15 LAB — AMPHETAMINES-URINE RANDOM: Lab: NEGATIVE

## 2017-03-15 LAB — OPIATES-URINE RANDOM: Lab: NEGATIVE

## 2017-03-15 LAB — PTT (APTT): Lab: 34 s (ref 24.0–36.5)

## 2017-03-16 ENCOUNTER — Ambulatory Visit: Admit: 2017-03-16 | Discharge: 2017-03-16 | Payer: MEDICAID

## 2017-03-16 ENCOUNTER — Ambulatory Visit: Admit: 2017-03-16 | Discharge: 2017-03-16

## 2017-03-16 ENCOUNTER — Encounter: Admit: 2017-03-16 | Discharge: 2017-03-16

## 2017-03-16 ENCOUNTER — Ambulatory Visit: Admit: 2017-03-16 | Discharge: 2017-03-17 | Payer: MEDICAID

## 2017-03-16 DIAGNOSIS — F1021 Alcohol dependence, in remission: ICD-10-CM

## 2017-03-16 DIAGNOSIS — Z01818 Encounter for other preprocedural examination: Principal | ICD-10-CM

## 2017-03-16 DIAGNOSIS — K7031 Alcoholic cirrhosis of liver with ascites: Principal | ICD-10-CM

## 2017-03-16 DIAGNOSIS — K7460 Unspecified cirrhosis of liver: Principal | ICD-10-CM

## 2017-03-16 DIAGNOSIS — F1020 Alcohol dependence, uncomplicated: ICD-10-CM

## 2017-03-16 DIAGNOSIS — I8500 Esophageal varices without bleeding: Principal | ICD-10-CM

## 2017-03-16 LAB — BLOOD GASES, ARTERIAL
Lab: 10 MMOL/L
Lab: 23 mmHg — ABNORMAL LOW (ref 35–45)
Lab: 7.3 (ref 7.35–7.45)

## 2017-03-16 LAB — HEMOGLOBIN & HEMATOCRIT, BG
Lab: 24 % — ABNORMAL LOW (ref 40–50)
Lab: 7.7 g/dL — ABNORMAL LOW (ref 13.5–16.5)

## 2017-03-16 LAB — CARBON MONOXIDE,BG: Lab: 0.9 % — ABNORMAL HIGH (ref <2.0)

## 2017-03-16 LAB — METHEMOGLOBIN: Lab: 0.6 % (ref <1.5)

## 2017-03-16 LAB — ETHYL GLUCURONIDE SCREEN WITH REFLEX, URINE: Lab: NEGATIVE

## 2017-03-16 LAB — CULTURE-URINE W/SENSITIVITY

## 2017-03-16 LAB — C-PEPTIDE: Lab: 8.2 — ABNORMAL HIGH

## 2017-03-16 LAB — ZINC: Lab: 0.3 — ABNORMAL LOW

## 2017-03-16 LAB — ANTI-SMOOTH MUSCLE AB: Lab: <20 {titer} (ref <20)

## 2017-03-16 LAB — ANTI-MITOCHONDRIAL ANTIBODY: Lab: <20 {titer} (ref <20)

## 2017-03-16 MED ORDER — LACTULOSE 10 GRAM/15 ML PO SOLN
20 g | Freq: Three times a day (TID) | ORAL | 3 refills | 21.00000 days | Status: AC
Start: 2017-03-16 — End: 2018-01-11

## 2017-03-16 MED ORDER — SODIUM CHLORIDE 0.9 % IV SOLP
INTRAVENOUS | 0 refills | Status: CN
Start: 2017-03-16 — End: ?

## 2017-03-17 ENCOUNTER — Ambulatory Visit: Admit: 2017-03-17 | Discharge: 2017-03-17

## 2017-03-17 ENCOUNTER — Ambulatory Visit: Admit: 2017-03-17 | Discharge: 2017-03-17 | Payer: MEDICAID

## 2017-03-17 ENCOUNTER — Ambulatory Visit: Admit: 2017-03-17 | Discharge: 2017-03-18

## 2017-03-17 ENCOUNTER — Encounter: Admit: 2017-03-17 | Discharge: 2017-03-17 | Payer: MEDICAID

## 2017-03-17 DIAGNOSIS — D638 Anemia in other chronic diseases classified elsewhere: ICD-10-CM

## 2017-03-17 DIAGNOSIS — I81 Portal vein thrombosis: ICD-10-CM

## 2017-03-17 DIAGNOSIS — Z87891 Personal history of nicotine dependence: ICD-10-CM

## 2017-03-17 DIAGNOSIS — Z01818 Encounter for other preprocedural examination: Principal | ICD-10-CM

## 2017-03-17 DIAGNOSIS — I8510 Secondary esophageal varices without bleeding: ICD-10-CM

## 2017-03-17 DIAGNOSIS — D696 Thrombocytopenia, unspecified: ICD-10-CM

## 2017-03-17 DIAGNOSIS — K7290 Hepatic failure, unspecified without coma: Principal | ICD-10-CM

## 2017-03-17 DIAGNOSIS — Z79899 Other long term (current) drug therapy: ICD-10-CM

## 2017-03-17 DIAGNOSIS — K7031 Alcoholic cirrhosis of liver with ascites: ICD-10-CM

## 2017-03-17 LAB — ALPHA 1 ANTITRYPSIN PHENOTYPE: Lab: 148

## 2017-03-17 LAB — T SPOT TB (QUANTIFERON TB)
Lab: 0
Lab: NEGATIVE (ref 1.003–1.035)

## 2017-03-17 LAB — 1,25 DIHYDROXY VITAMIN D: Lab: <10 pg/mL — ABNORMAL LOW (ref 19.9–79.3)

## 2017-03-17 MED ORDER — SODIUM CHLORIDE 0.9 % IJ SOLN
50 mL | Freq: Once | INTRAVENOUS | 0 refills | Status: AC
Start: 2017-03-17 — End: ?

## 2017-03-17 MED ORDER — GADOBENATE DIMEGLUMINE 529 MG/ML (0.1MMOL/0.2ML) IV SOLN
15 mL | Freq: Once | INTRAVENOUS | 0 refills | Status: CP
Start: 2017-03-17 — End: ?
  Administered 2017-03-17: 22:00:00 15 mL via INTRAVENOUS

## 2017-03-17 MED ORDER — ALBUMIN, HUMAN 25 % IV SOLP
0 refills | Status: CP
Start: 2017-03-17 — End: ?
  Administered 2017-03-17: 14:00:00 87.5 g via INTRAVENOUS

## 2017-03-18 ENCOUNTER — Encounter: Admit: 2017-03-18 | Discharge: 2017-03-18

## 2017-03-18 LAB — CELL COUNT W/DIFF-FLUIDS
Lab: 1 %
Lab: 37 /uL
Lab: 47 %
Lab: <10 /uL

## 2017-03-18 MED ORDER — CHOLECALCIFEROL (VITAMIN D3) 50,000 UNIT PO CAP
50000 [IU] | ORAL_CAPSULE | ORAL | 0 refills | 84.00000 days | Status: AC
Start: 2017-03-18 — End: 2017-04-13

## 2017-03-18 MED ORDER — VITAMIN A 10,000 UNIT PO CAP
10000 [IU] | ORAL_CAPSULE | Freq: Every day | ORAL | 3 refills | Status: AC
Start: 2017-03-18 — End: 2018-01-14

## 2017-03-19 LAB — VITAMIN E: Lab: 5.7

## 2017-03-19 LAB — VITAMIN A: Lab: 6.2 — ABNORMAL LOW

## 2017-03-25 ENCOUNTER — Encounter: Admit: 2017-03-25 | Discharge: 2017-03-25

## 2017-03-25 DIAGNOSIS — K7031 Alcoholic cirrhosis of liver with ascites: Principal | ICD-10-CM

## 2017-03-25 LAB — COMPREHENSIVE METABOLIC PANEL
Lab: 1.7 mg/dL — ABNORMAL HIGH (ref 0.72–1.25)
Lab: 109 K/UL — ABNORMAL LOW (ref >=60)
Lab: 111 {cells}/uL — ABNORMAL HIGH (ref 98–107)
Lab: 135 mmol/L — ABNORMAL LOW (ref 136–145)
Lab: 14 {cells}/uL (ref 200–950)
Lab: 14 {cells}/uL — ABNORMAL LOW (ref 22–29)
Lab: 179 % — ABNORMAL HIGH (ref 70–105)
Lab: 2.1 mg/dL — ABNORMAL HIGH (ref 0.0–1.0)
Lab: 20 K/UL — ABNORMAL HIGH (ref >=60)
Lab: 3.7 g/dL (ref 32.0–36.0)
Lab: 4.2 fL (ref 7.5–12.5)
Lab: 40 % — ABNORMAL HIGH (ref 8.9–20.6)
Lab: 43 K/UL — ABNORMAL HIGH (ref <=4.0)
Lab: 47 K/UL — ABNORMAL LOW (ref >59)
Lab: 5.5 pg — ABNORMAL LOW (ref 6.4–8.3)
Lab: 8.8 % — ABNORMAL HIGH (ref 0.0–6.0)

## 2017-03-25 LAB — PROTIME INR (PT)
Lab: 13 — ABNORMAL HIGH (ref 9.9–12.1)
Lab: 1.2 ratio

## 2017-03-25 LAB — CBC AND DIFF
Lab: 0.1 10*3/uL (ref 4.5–11.0)
Lab: 3 % — ABNORMAL LOW (ref 4.70–6.10)
Lab: 5.1 % — ABNORMAL HIGH (ref 24–44)

## 2017-03-29 ENCOUNTER — Ambulatory Visit: Admit: 2017-03-29 | Discharge: 2017-03-29

## 2017-03-29 ENCOUNTER — Encounter: Admit: 2017-03-29 | Discharge: 2017-03-29

## 2017-03-29 ENCOUNTER — Ambulatory Visit: Admit: 2017-03-29 | Discharge: 2017-03-30 | Payer: MEDICAID

## 2017-03-29 ENCOUNTER — Ambulatory Visit: Admit: 2017-03-29 | Discharge: 2017-03-29 | Payer: MEDICAID

## 2017-03-29 DIAGNOSIS — F1020 Alcohol dependence, uncomplicated: ICD-10-CM

## 2017-03-29 DIAGNOSIS — Z01818 Encounter for other preprocedural examination: Principal | ICD-10-CM

## 2017-03-29 DIAGNOSIS — K7031 Alcoholic cirrhosis of liver with ascites: Principal | ICD-10-CM

## 2017-03-29 DIAGNOSIS — K7460 Unspecified cirrhosis of liver: Principal | ICD-10-CM

## 2017-03-30 ENCOUNTER — Encounter: Admit: 2017-03-30 | Discharge: 2017-03-30

## 2017-03-31 ENCOUNTER — Encounter: Admit: 2017-03-31 | Discharge: 2017-03-31

## 2017-04-01 ENCOUNTER — Encounter: Admit: 2017-04-01 | Discharge: 2017-04-01

## 2017-04-07 ENCOUNTER — Encounter: Admit: 2017-04-07 | Discharge: 2017-04-07

## 2017-04-08 ENCOUNTER — Encounter: Admit: 2017-04-08 | Discharge: 2017-04-08

## 2017-04-08 DIAGNOSIS — F1020 Alcohol dependence, uncomplicated: ICD-10-CM

## 2017-04-08 DIAGNOSIS — K7460 Unspecified cirrhosis of liver: Principal | ICD-10-CM

## 2017-04-09 ENCOUNTER — Encounter: Admit: 2017-04-09 | Discharge: 2017-04-09

## 2017-04-12 ENCOUNTER — Encounter: Admit: 2017-04-12 | Discharge: 2017-04-12

## 2017-04-12 DIAGNOSIS — K7031 Alcoholic cirrhosis of liver with ascites: Principal | ICD-10-CM

## 2017-04-12 LAB — COMPREHENSIVE METABOLIC PANEL
Lab: 1.7 mg/dL — ABNORMAL HIGH (ref 0.72–1.25)
Lab: 1.8 /dL — ABNORMAL HIGH (ref 0.0–1.0)
Lab: 117 mg/dL — ABNORMAL HIGH (ref 70–105)
Lab: 119 IU/L (ref 40–150)
Lab: 137 MMOL/L — ABNORMAL HIGH (ref 136–145)
Lab: 15 /uL — ABNORMAL LOW (ref 22–29)
Lab: 15 U/L — ABNORMAL HIGH (ref 10–55)
Lab: 3.6 g/dL — ABNORMAL LOW (ref 3.5–5.0)
Lab: 35 U/L — ABNORMAL HIGH (ref 15–34)
Lab: 38 % — ABNORMAL HIGH (ref 8.9–20.6)
Lab: 4.5 /uL (ref 3.5–5.1)
Lab: 45 ML/MIN/1.73 — ABNORMAL LOW (ref >59)
Lab: 5.3 g/dL — ABNORMAL LOW (ref 6.4–8.3)
Lab: 9 g/dL — ABNORMAL HIGH (ref 8.4–10.2)

## 2017-04-12 LAB — CBC AND DIFF
Lab: 0.1 /uL (ref 0.0–0.2)
Lab: 4.8 UL (ref 4.8–10.8)

## 2017-04-12 LAB — PROTIME INR (PT): Lab: 14 — ABNORMAL HIGH (ref 9.9–12.1)

## 2017-04-13 ENCOUNTER — Ambulatory Visit: Admit: 2017-04-13 | Discharge: 2017-04-13 | Payer: MEDICAID

## 2017-04-13 ENCOUNTER — Encounter: Admit: 2017-04-13 | Discharge: 2017-04-13

## 2017-04-13 ENCOUNTER — Ambulatory Visit: Admit: 2017-04-13 | Discharge: 2017-04-14 | Payer: MEDICAID

## 2017-04-13 DIAGNOSIS — F1020 Alcohol dependence, uncomplicated: ICD-10-CM

## 2017-04-13 DIAGNOSIS — K7031 Alcoholic cirrhosis of liver with ascites: Principal | ICD-10-CM

## 2017-04-13 DIAGNOSIS — K7460 Unspecified cirrhosis of liver: Principal | ICD-10-CM

## 2017-04-13 DIAGNOSIS — Z01818 Encounter for other preprocedural examination: Principal | ICD-10-CM

## 2017-04-13 LAB — CBC AND DIFF
Lab: 0.1 10*3/uL (ref 0–0.20)
Lab: 0.4 10*3/uL (ref 0–0.80)
Lab: 0.5 10*3/uL — ABNORMAL HIGH (ref 0–0.45)
Lab: 11 % — ABNORMAL HIGH (ref 0–5)
Lab: 139 10*3/uL — ABNORMAL LOW (ref 150–400)
Lab: 2.5 10*3/uL (ref 1.8–7.0)
Lab: 2.9 M/UL — ABNORMAL LOW (ref 4.4–5.5)
Lab: 22 % — ABNORMAL LOW (ref 24–44)
Lab: 28 % — ABNORMAL LOW (ref 40–50)
Lab: 3 % — ABNORMAL HIGH (ref 0–2)
Lab: 32 pg (ref 26–34)
Lab: 33 g/dL (ref 32.0–36.0)
Lab: 4.4 10*3/uL — ABNORMAL LOW (ref 4.5–11.0)
Lab: 56 % — ABNORMAL LOW (ref >60)
Lab: 7.4 FL — ABNORMAL LOW (ref >60)
Lab: 8 % (ref 4–12)
Lab: 9.6 g/dL — ABNORMAL LOW (ref 13.5–16.5)
Lab: 97 FL (ref 80–100)

## 2017-04-13 LAB — PROTIME INR (PT): Lab: 1.3 — ABNORMAL HIGH (ref 0.8–1.2)

## 2017-04-13 LAB — COMPREHENSIVE METABOLIC PANEL
Lab: 1.6 mg/dL — ABNORMAL HIGH (ref 0.4–1.24)
Lab: 111 MMOL/L — ABNORMAL HIGH (ref 98–110)
Lab: 136 MMOL/L — ABNORMAL LOW (ref 137–147)
Lab: 148 mg/dL — ABNORMAL HIGH (ref 70–100)
Lab: 33 mg/dL — ABNORMAL HIGH (ref 7–25)
Lab: 4.4 MMOL/L (ref 3.5–5.1)

## 2017-04-13 LAB — 25-OH VITAMIN D (D2 + D3): Lab: 12 ng/mL — ABNORMAL LOW (ref 30–80)

## 2017-04-14 ENCOUNTER — Encounter: Admit: 2017-04-14 | Discharge: 2017-04-14

## 2017-04-14 DIAGNOSIS — K7031 Alcoholic cirrhosis of liver with ascites: Principal | ICD-10-CM

## 2017-04-14 MED ORDER — CIPROFLOXACIN HCL 500 MG PO TAB
500 mg | ORAL_TABLET | Freq: Every day | ORAL | 3 refills | 10.00000 days | Status: AC
Start: 2017-04-14 — End: 2017-08-20

## 2017-04-14 MED ORDER — CHOLECALCIFEROL (VITAMIN D3) 50,000 UNIT PO CAP
50000 [IU] | ORAL_CAPSULE | ORAL | 0 refills | 84.00000 days | Status: AC
Start: 2017-04-14 — End: 2018-01-13

## 2017-04-15 LAB — ETHYL GLUCURONIDE SCREEN WITH REFLEX, URINE: Lab: NEGATIVE 10*3/uL (ref 1.0–4.8)

## 2017-04-16 ENCOUNTER — Encounter: Admit: 2017-04-16 | Discharge: 2017-04-16

## 2017-04-16 DIAGNOSIS — K7031 Alcoholic cirrhosis of liver with ascites: Principal | ICD-10-CM

## 2017-04-16 LAB — PROTIME INR (PT): Lab: 14 — ABNORMAL HIGH (ref 9.9–12.1)

## 2017-04-16 LAB — COMPREHENSIVE METABOLIC PANEL
Lab: 12 — ABNORMAL LOW (ref 33.0–37.0)
Lab: 124 — ABNORMAL HIGH (ref 70–105)
Lab: 4.8 — ABNORMAL LOW (ref 42.0–52.0)
Lab: 1.4 mg/dL — ABNORMAL HIGH (ref 0.72–1.25)
Lab: 1.5 mg/dL — ABNORMAL HIGH (ref 0.0–1.0)
Lab: 113 — ABNORMAL HIGH (ref 98–107)
Lab: 137 mmol/L — ABNORMAL LOW (ref 14.0–18.0)
Lab: 17 — ABNORMAL LOW (ref 22–29)
Lab: 31 — ABNORMAL HIGH (ref 0.0–2.0)
Lab: 31 — ABNORMAL HIGH (ref 8.9–20.6)
Lab: 60
Lab: 8.8

## 2017-04-16 LAB — CBC AND DIFF: Lab: 4.7 — ABNORMAL LOW (ref 4.8–10.8)

## 2017-04-16 LAB — VITAMIN A

## 2017-04-26 ENCOUNTER — Encounter: Admit: 2017-04-26 | Discharge: 2017-04-26

## 2017-04-26 ENCOUNTER — Ambulatory Visit: Admit: 2017-04-26 | Discharge: 2017-04-26

## 2017-04-26 ENCOUNTER — Ambulatory Visit: Admit: 2017-04-26 | Discharge: 2017-04-26 | Payer: MEDICAID

## 2017-04-26 DIAGNOSIS — Z8719 Personal history of other diseases of the digestive system: ICD-10-CM

## 2017-04-26 DIAGNOSIS — K3189 Other diseases of stomach and duodenum: ICD-10-CM

## 2017-04-26 DIAGNOSIS — F1020 Alcohol dependence, uncomplicated: ICD-10-CM

## 2017-04-26 DIAGNOSIS — K766 Portal hypertension: ICD-10-CM

## 2017-04-26 DIAGNOSIS — K7460 Unspecified cirrhosis of liver: Principal | ICD-10-CM

## 2017-04-26 DIAGNOSIS — I8500 Esophageal varices without bleeding: Principal | ICD-10-CM

## 2017-04-26 DIAGNOSIS — Z87891 Personal history of nicotine dependence: ICD-10-CM

## 2017-04-26 MED ORDER — FENTANYL CITRATE (PF) 50 MCG/ML IJ SOLN
50 ug | INTRAVENOUS | 0 refills | Status: CN | PRN
Start: 2017-04-26 — End: ?

## 2017-04-26 MED ORDER — PROMETHAZINE 25 MG/ML IJ SOLN
6.25 mg | INTRAVENOUS | 0 refills | Status: CN | PRN
Start: 2017-04-26 — End: ?

## 2017-04-26 MED ORDER — FENTANYL CITRATE (PF) 50 MCG/ML IJ SOLN
25 ug | INTRAVENOUS | 0 refills | Status: CN | PRN
Start: 2017-04-26 — End: ?

## 2017-04-26 MED ORDER — METOCLOPRAMIDE HCL 5 MG/ML IJ SOLN
10 mg | Freq: Once | INTRAVENOUS | 0 refills | Status: CN | PRN
Start: 2017-04-26 — End: ?

## 2017-04-26 MED ORDER — LACTATED RINGERS IV SOLP
1000 mL | INTRAVENOUS | 0 refills | Status: DC
Start: 2017-04-26 — End: 2017-04-26
  Administered 2017-04-26: 19:00:00 1000 mL via INTRAVENOUS

## 2017-04-26 MED ORDER — PROPOFOL INJ 10 MG/ML IV VIAL
0 refills | Status: DC
Start: 2017-04-26 — End: 2017-04-26
  Administered 2017-04-26: 20:00:00 10 mg via INTRAVENOUS
  Administered 2017-04-26: 19:00:00 30 mg via INTRAVENOUS
  Administered 2017-04-26: 19:00:00 50 mg via INTRAVENOUS
  Administered 2017-04-26: 20:00:00 20 mg via INTRAVENOUS

## 2017-04-26 MED ORDER — KETAMINE 10 MG/ML IJ SOLN
0 refills | Status: DC
Start: 2017-04-26 — End: 2017-04-26
  Administered 2017-04-26: 19:00:00 20 mg via INTRAVENOUS

## 2017-04-26 MED ORDER — DIPHENHYDRAMINE HCL 50 MG/ML IJ SOLN
25 mg | Freq: Once | INTRAVENOUS | 0 refills | Status: CN | PRN
Start: 2017-04-26 — End: ?

## 2017-04-26 MED ORDER — LIDOCAINE (PF) 200 MG/10 ML (2 %) IJ SYRG
0 refills | Status: DC
Start: 2017-04-26 — End: 2017-04-26
  Administered 2017-04-26: 19:00:00 80 mg via INTRAVENOUS

## 2017-04-26 MED ORDER — PROPOFOL 10 MG/ML IV EMUL 20 ML (INFUSION)(AM)(OR)
INTRAVENOUS | 0 refills | Status: DC
Start: 2017-04-26 — End: 2017-04-26
  Administered 2017-04-26: 19:00:00 150 ug/kg/min via INTRAVENOUS

## 2017-04-28 ENCOUNTER — Encounter: Admit: 2017-04-28 | Discharge: 2017-04-28

## 2017-04-28 DIAGNOSIS — K7460 Unspecified cirrhosis of liver: Principal | ICD-10-CM

## 2017-04-28 DIAGNOSIS — F1020 Alcohol dependence, uncomplicated: ICD-10-CM

## 2017-05-14 ENCOUNTER — Encounter: Admit: 2017-05-14 | Discharge: 2017-05-14

## 2017-05-27 ENCOUNTER — Encounter: Admit: 2017-05-27 | Discharge: 2017-05-27

## 2017-05-27 DIAGNOSIS — K7031 Alcoholic cirrhosis of liver with ascites: Principal | ICD-10-CM

## 2017-05-28 ENCOUNTER — Encounter: Admit: 2017-05-28 | Discharge: 2017-05-28

## 2017-05-28 DIAGNOSIS — K7031 Alcoholic cirrhosis of liver with ascites: Principal | ICD-10-CM

## 2017-05-28 LAB — CBC AND DIFF
Lab: 10 — ABNORMAL LOW (ref 14.0–18.0)
Lab: 3.2 — ABNORMAL LOW (ref 4.70–6.10)
Lab: 31 — ABNORMAL LOW (ref 42.0–52.0)
Lab: 3.4 — ABNORMAL LOW (ref 4.8–10.8)

## 2017-05-28 LAB — COMPREHENSIVE METABOLIC PANEL
Lab: 109 mmol/L — ABNORMAL HIGH (ref 98–107)
Lab: 120 mg/dL — ABNORMAL HIGH (ref 70–105)
Lab: 136 mmol/L (ref 80.0–99.0)
Lab: 15 U/L
Lab: 2 mg/dL — ABNORMAL HIGH (ref 0.72–1.25)
Lab: 2.4 mg/dL — ABNORMAL HIGH (ref 0.0–1.0)
Lab: 3.8 mmol/L — ABNORMAL HIGH (ref 27.0–31.0)
Lab: 40 U/L — ABNORMAL HIGH (ref 5–34)
Lab: 40 mL/min/{1.73_m2} — ABNORMAL LOW (ref >59)
Lab: 8.4 mg/dL
Lab: 88 U/L

## 2017-05-28 LAB — PROTIME INR (PT): Lab: 16 s — ABNORMAL HIGH (ref 9.9–12.1)

## 2017-05-28 LAB — ALCOHOL LEVEL: Lab: <10 mg/dL (ref <10)

## 2017-05-29 ENCOUNTER — Encounter: Admit: 2017-05-29 | Discharge: 2017-05-29

## 2017-06-01 MED ORDER — MIDODRINE 10 MG PO TAB
ORAL_TABLET | Freq: Three times a day (TID) | 1 refills | Status: AC
Start: 2017-06-01 — End: 2017-08-20

## 2017-06-03 ENCOUNTER — Encounter: Admit: 2017-06-03 | Discharge: 2017-06-03

## 2017-06-03 DIAGNOSIS — K7031 Alcoholic cirrhosis of liver with ascites: Principal | ICD-10-CM

## 2017-06-03 LAB — CBC AND DIFF
Lab: 1.9
Lab: 2.5
Lab: 5.7
Lab: 54
Lab: 0.3 10*3/uL (ref 0.1–0.9)
Lab: 1.4 10*3/uL (ref 0.9–5.1)
Lab: 10 g/dL — ABNORMAL LOW (ref 14.0–18.0)
Lab: 160 10*3/uL (ref 130–400)
Lab: 3.2 — ABNORMAL LOW (ref 4.70–6.10)
Lab: 30
Lab: 31 % — ABNORMAL LOW (ref 42.0–52.0)
Lab: 31 pg — ABNORMAL HIGH (ref 27.0–31.0)
Lab: 33 g/dL (ref 33.0–37.0)
Lab: 4.6 10*3/uL — ABNORMAL LOW (ref 4.8–10.8)
Lab: 7.4

## 2017-06-03 LAB — COMPREHENSIVE METABOLIC PANEL
Lab: 1.4 mg/dL — ABNORMAL HIGH (ref 0.72–1.25)
Lab: 1.7 mg/dL — ABNORMAL HIGH (ref 0.0–1.0)
Lab: 102 U/L (ref 40–150)
Lab: 112 mmol/L — ABNORMAL HIGH (ref 98–107)
Lab: 119 mg/dL — ABNORMAL HIGH (ref 70–105)
Lab: 138 mmol/L (ref 136–145)
Lab: 17 U/L (ref 0–55)
Lab: 19 mmol/L — ABNORMAL LOW (ref 22–29)
Lab: 23 mg/dL — ABNORMAL HIGH (ref 8.9–20.6)
Lab: 3.6 g/dL (ref 3.5–5.0)
Lab: 3.6 mmol/L (ref 3.5–5.1)
Lab: 38 U/L — ABNORMAL HIGH (ref 5–34)
Lab: 5.2 g/dL — ABNORMAL LOW (ref 6.4–8.3)
Lab: 59 mL/min/{1.73_m2} (ref >59)
Lab: 8.4 mg/dL (ref 8.4–10.2)

## 2017-06-03 LAB — PROTIME INR (PT)
Lab: 1.4
Lab: 15 s — ABNORMAL HIGH (ref 9.9–12.1)

## 2017-06-07 ENCOUNTER — Encounter: Admit: 2017-06-07 | Discharge: 2017-06-07

## 2017-06-11 ENCOUNTER — Encounter: Admit: 2017-06-11 | Discharge: 2017-06-11

## 2017-06-11 DIAGNOSIS — K7031 Alcoholic cirrhosis of liver with ascites: Principal | ICD-10-CM

## 2017-06-11 LAB — COMPREHENSIVE METABOLIC PANEL
Lab: 1.4 mg/dL — ABNORMAL HIGH (ref 0.72–1.25)
Lab: 101 U/L (ref 40–150)
Lab: 139 mmol/L — ABNORMAL LOW (ref 136–145)
Lab: 19 U/L (ref 0–55)
Lab: 19 mmol/L — ABNORMAL LOW (ref 22–29)
Lab: 2.7 mg/dL — ABNORMAL HIGH (ref 0.0–1.0)
Lab: 22 mg/dL — ABNORMAL HIGH (ref 8.9–20.6)
Lab: 3.6 mmol/L (ref 3.5–5.1)
Lab: 3.8 g/dL (ref 3.5–5.0)
Lab: 5.4 g/dL — ABNORMAL LOW (ref 6.4–8.3)
Lab: 58 mL/Min/1.73 — ABNORMAL LOW (ref >59)
Lab: 8.8 mg/dL (ref 8.4–10.2)
Lab: 94 mg/dL (ref 70–105)

## 2017-06-11 LAB — CBC AND DIFF
Lab: 14 % (ref 11.5–14.5)
Lab: 2.8 U/L — ABNORMAL HIGH (ref 1.9–8.1)
Lab: 31 pg — ABNORMAL HIGH (ref 27.0–31.0)
Lab: 4.8 10*3/uL (ref 4.8–10.8)

## 2017-06-11 LAB — PROTIME INR (PT)
Lab: 1.3 g/dL — ABNORMAL LOW (ref 14.0–18.0)
Lab: 14 s — ABNORMAL HIGH (ref 9.9–12.1)

## 2017-06-22 ENCOUNTER — Encounter: Admit: 2017-06-22 | Discharge: 2017-06-22

## 2017-06-25 ENCOUNTER — Encounter: Admit: 2017-06-25 | Discharge: 2017-06-25

## 2017-06-25 DIAGNOSIS — K7031 Alcoholic cirrhosis of liver with ascites: Principal | ICD-10-CM

## 2017-06-25 LAB — COMPREHENSIVE METABOLIC PANEL
Lab: 1.3 mg/dL — ABNORMAL HIGH (ref 0.72–1.25)
Lab: 110 mmol/L — ABNORMAL HIGH (ref 98–107)
Lab: 117 U/L (ref 40–150)
Lab: 12 meq/L (ref 0–14)
Lab: 128 mg/dL — ABNORMAL HIGH (ref 70–105)
Lab: 138 mmol/L (ref 136–145)
Lab: 16 mg/dL (ref 8.9–20.6)
Lab: 17 U/L (ref 0–55)
Lab: 20 mmol/L — ABNORMAL LOW (ref 22–29)
Lab: 3.8 mmol/L (ref 3.5–5.1)
Lab: 64 mL/min/{1.73_m2} (ref >59)
Lab: 8.5 mg/dL (ref 8.4–10.2)

## 2017-06-25 LAB — PROTIME INR (PT)
Lab: 1.5 U/L (ref 5–34)
Lab: 16 s — ABNORMAL HIGH (ref 9.9–12.1)

## 2017-06-25 LAB — CBC AND DIFF
Lab: 10 g/dL — ABNORMAL LOW (ref 14.0–18.0)
Lab: 118 10*3/uL — ABNORMAL LOW (ref 130–400)
Lab: 14 % (ref 11.5–14.5)
Lab: 3.3 x10-3/uL — ABNORMAL LOW (ref 4.8–10.8)
Lab: 3.4 g/dL (ref 3.5–5.0)
Lab: 31 pg — ABNORMAL HIGH (ref 27.0–31.0)
Lab: 32 % — ABNORMAL LOW (ref 42.0–52.0)
Lab: 32 g/dL — ABNORMAL LOW (ref 33.0–37.0)
Lab: 96 fL (ref 80.0–99.0)

## 2017-06-29 ENCOUNTER — Encounter: Admit: 2017-06-29 | Discharge: 2017-06-29

## 2017-07-05 ENCOUNTER — Encounter: Admit: 2017-07-05 | Discharge: 2017-07-05

## 2017-07-05 DIAGNOSIS — K7031 Alcoholic cirrhosis of liver with ascites: Principal | ICD-10-CM

## 2017-07-05 LAB — COMPREHENSIVE METABOLIC PANEL
Lab: 101 U/L (ref 40–150)
Lab: 111 mmol/L — ABNORMAL HIGH (ref 98–107)
Lab: 139 mmol/L (ref 136–145)
Lab: 18 U/L (ref 0–55)
Lab: 2 mg/dL — ABNORMAL HIGH (ref 0.0–1.0)
Lab: 3.5 g/dL (ref 3.5–5.0)
Lab: 3.9 mmol/L (ref 3.5–5.1)
Lab: 5 g/dL — ABNORMAL LOW (ref 6.4–8.3)
Lab: 7.9 mg/dL — ABNORMAL LOW (ref 8.4–10.2)

## 2017-07-05 LAB — CBC AND DIFF
Lab: 0 % (ref 8.5–10.6)
Lab: 0 % — ABNORMAL HIGH (ref <100)
Lab: 10 g/dL — ABNORMAL LOW (ref 14.0–18.0)
Lab: 2 % — ABNORMAL HIGH (ref <150)
Lab: 3.8 10*3/uL — ABNORMAL LOW (ref 4.8–10.8)
Lab: 30 % — ABNORMAL LOW (ref 42.0–52.0)
Lab: 58 % — ABNORMAL LOW (ref >40)
Lab: 94 fL (ref 80.0–99.0)

## 2017-07-05 LAB — PROTIME INR (PT)
Lab: 1.3 g/dL — ABNORMAL LOW (ref 3.5–5.0)
Lab: 14 s — ABNORMAL HIGH (ref 9.9–12.1)

## 2017-07-06 ENCOUNTER — Encounter: Admit: 2017-07-06 | Discharge: 2017-07-06

## 2017-07-12 ENCOUNTER — Encounter: Admit: 2017-07-12 | Discharge: 2017-07-12

## 2017-07-12 DIAGNOSIS — K7031 Alcoholic cirrhosis of liver with ascites: Principal | ICD-10-CM

## 2017-07-12 LAB — PROTIME INR (PT): Lab: 14 s — ABNORMAL HIGH (ref 9.9–12.1)

## 2017-07-12 LAB — CBC AND DIFF
Lab: 2.9
Lab: 0.1 10*3/uL (ref 0.0–0.2)
Lab: 0.2
Lab: 0.4 10*3/uL (ref 0.1–0.9)
Lab: 1.1 10*3/uL (ref 0.9–5.1)
Lab: 1.6 g/dL (ref 3.5–5.0)
Lab: 11 g/dL — ABNORMAL LOW (ref 14.0–18.0)
Lab: 13 % (ref 11.5–14.5)
Lab: 165 10*3/uL — ABNORMAL HIGH (ref 130–400)
Lab: 23 U/L (ref 0–55)
Lab: 3.4 mmol/L — ABNORMAL LOW (ref 4.70–6.10)
Lab: 32 % — ABNORMAL LOW (ref 42.0–52.0)
Lab: 32 pg — ABNORMAL HIGH (ref >59)
Lab: 34 g/dL (ref 33.0–37.0)
Lab: 4.4 g/dL — ABNORMAL LOW (ref 6.4–8.3)
Lab: 4.7 x10-3/uL — ABNORMAL LOW (ref 4.8–10.8)
Lab: 60 U/L (ref 5–34)
Lab: 9.4 U/L (ref 40–150)
Lab: 94 fL — ABNORMAL HIGH (ref 80.0–99.0)

## 2017-07-12 LAB — COMPREHENSIVE METABOLIC PANEL: Lab: 140 mmol/L (ref 136–145)

## 2017-07-13 ENCOUNTER — Encounter: Admit: 2017-07-13 | Discharge: 2017-07-13

## 2017-07-13 ENCOUNTER — Ambulatory Visit: Admit: 2017-07-13 | Discharge: 2017-07-14 | Payer: MEDICAID

## 2017-07-13 DIAGNOSIS — F1020 Alcohol dependence, uncomplicated: ICD-10-CM

## 2017-07-13 DIAGNOSIS — K7460 Unspecified cirrhosis of liver: Principal | ICD-10-CM

## 2017-07-14 DIAGNOSIS — K7031 Alcoholic cirrhosis of liver with ascites: Principal | ICD-10-CM

## 2017-07-27 ENCOUNTER — Encounter: Admit: 2017-07-27 | Discharge: 2017-07-27

## 2017-07-27 DIAGNOSIS — F1020 Alcohol dependence, uncomplicated: ICD-10-CM

## 2017-07-27 DIAGNOSIS — K7460 Unspecified cirrhosis of liver: Principal | ICD-10-CM

## 2017-08-02 ENCOUNTER — Encounter: Admit: 2017-08-02 | Discharge: 2017-08-02

## 2017-08-06 ENCOUNTER — Encounter: Admit: 2017-08-06 | Discharge: 2017-08-06

## 2017-08-06 DIAGNOSIS — K7031 Alcoholic cirrhosis of liver with ascites: Principal | ICD-10-CM

## 2017-08-06 LAB — COMPREHENSIVE METABOLIC PANEL
Lab: 1.3 mg/dL — ABNORMAL HIGH (ref 0.72–1.25)
Lab: 11 meq/L (ref 0–14)
Lab: 112 mmol/L — ABNORMAL HIGH (ref 98–107)
Lab: 120 mg/dL — ABNORMAL HIGH (ref 70–105)
Lab: 140 mmol/L (ref <100)
Lab: 17 mg/dL (ref 8.9–20.6)
Lab: 22 mmol/L (ref 22–29)
Lab: 4.5 mmol/L (ref 3.5–5.1)
Lab: 62 mL/Min/1.73 (ref >59)

## 2017-08-06 LAB — CBC AND DIFF
Lab: 0.1 10*3/uL (ref 0.0–0.2)
Lab: 0.2 10*3/uL (ref 0.0–0.6)
Lab: 0.3 10*3/uL (ref 0.1–0.9)
Lab: 1.2 10*3/uL (ref 0.9–5.1)
Lab: 11 g/dL — ABNORMAL LOW (ref 14.0–18.0)
Lab: 13 % (ref 11.5–14.5)
Lab: 155 10*3/uL (ref 130–400)
Lab: 2.1 10*3/uL (ref 1.9–8.1)
Lab: 3.5 10*6/uL — ABNORMAL LOW (ref 4.70–6.10)
Lab: 3.8 10*3/uL — ABNORMAL LOW (ref 4.8–10.8)
Lab: 31 pg — ABNORMAL HIGH (ref 27.0–31.0)
Lab: 32 g/dL — ABNORMAL LOW (ref 33.0–37.0)
Lab: 33 % — ABNORMAL LOW (ref 42.0–52.0)
Lab: 4.5 % — ABNORMAL LOW (ref 0.0–6.0)
Lab: 7.5 % (ref 0.0–10.0)
Lab: 95 fL (ref 80.0–99.0)

## 2017-08-06 LAB — PROTIME INR (PT)
Lab: 1.2 % — ABNORMAL HIGH (ref 18.0–47.0)
Lab: 13 s — ABNORMAL HIGH (ref 9.9–12.1)

## 2017-08-09 ENCOUNTER — Encounter: Admit: 2017-08-09 | Discharge: 2017-08-09

## 2017-08-09 DIAGNOSIS — K7031 Alcoholic cirrhosis of liver with ascites: Principal | ICD-10-CM

## 2017-08-10 ENCOUNTER — Encounter: Admit: 2017-08-10 | Discharge: 2017-08-10

## 2017-08-10 DIAGNOSIS — K7031 Alcoholic cirrhosis of liver with ascites: Principal | ICD-10-CM

## 2017-08-10 LAB — CBC AND DIFF: Lab: 3 %

## 2017-08-12 ENCOUNTER — Encounter: Admit: 2017-08-12 | Discharge: 2017-08-12

## 2017-08-13 ENCOUNTER — Encounter: Admit: 2017-08-13 | Discharge: 2017-08-13

## 2017-08-18 ENCOUNTER — Encounter: Admit: 2017-08-18 | Discharge: 2017-08-18

## 2017-08-19 ENCOUNTER — Encounter: Admit: 2017-08-19 | Discharge: 2017-08-19

## 2017-08-20 MED ORDER — CIPROFLOXACIN HCL 500 MG PO TAB
ORAL_TABLET | Freq: Every day | ORAL | 3 refills | 10.00000 days | Status: AC
Start: 2017-08-20 — End: 2018-01-11

## 2017-08-20 MED ORDER — MIDODRINE 10 MG PO TAB
ORAL_TABLET | Freq: Three times a day (TID) | 1 refills | Status: AC
Start: 2017-08-20 — End: 2017-10-18

## 2017-08-24 ENCOUNTER — Encounter: Admit: 2017-08-24 | Discharge: 2017-08-24

## 2017-08-30 ENCOUNTER — Encounter: Admit: 2017-08-30 | Discharge: 2017-08-30

## 2017-08-30 DIAGNOSIS — K7031 Alcoholic cirrhosis of liver with ascites: Principal | ICD-10-CM

## 2017-08-30 LAB — CBC AND DIFF
Lab: 0.1 10*3/uL (ref 0.0–0.2)
Lab: 0.2
Lab: 0.4 10*3/uL (ref 0.1–0.9)
Lab: 1.3 10*3/uL (ref 0.9–5.1)
Lab: 11 g/dL — ABNORMAL LOW (ref 14.0–18.0)
Lab: 13 % (ref 11.5–14.5)
Lab: 168 10*3/uL — ABNORMAL HIGH (ref 130–400)
Lab: 2 g/dL (ref 3.5–5.0)
Lab: 2.2
Lab: 3.6 mmol/L — ABNORMAL LOW (ref 4.70–6.10)
Lab: 31 U/L (ref 0–55)
Lab: 31 pg — ABNORMAL HIGH (ref >59)
Lab: 34 % — ABNORMAL LOW (ref 42.0–52.0)
Lab: 34 g/dL — ABNORMAL HIGH (ref 33.0–37.0)
Lab: 4.2 10*3/uL — ABNORMAL LOW (ref 4.8–10.8)
Lab: 5.5 g/dL — ABNORMAL LOW (ref 6.4–8.3)
Lab: 52 U/L (ref 5–34)
Lab: 8.6 U/L (ref 40–150)
Lab: 93 fL — ABNORMAL HIGH (ref 80.0–99.0)

## 2017-08-30 LAB — COMPREHENSIVE METABOLIC PANEL: Lab: 142 mmol/L (ref 136–145)

## 2017-08-30 LAB — PROTIME INR (PT): Lab: 13 s — ABNORMAL HIGH (ref 9.9–12.1)

## 2017-09-03 ENCOUNTER — Encounter: Admit: 2017-09-03 | Discharge: 2017-09-03

## 2017-09-03 DIAGNOSIS — K7031 Alcoholic cirrhosis of liver with ascites: Principal | ICD-10-CM

## 2017-09-03 LAB — COMPREHENSIVE METABOLIC PANEL
Lab: 1.2 mg/dL (ref 0.72–1.25)
Lab: 1.3 mg/dL — ABNORMAL HIGH (ref 0.0–1.0)
Lab: 112 mmol/L — ABNORMAL HIGH (ref 98–107)
Lab: 13 meq/L (ref 0–14)
Lab: 142 mmol/L (ref 136–145)
Lab: 16 U/L (ref 0–55)
Lab: 16 mg/dL (ref 8.9–20.6)
Lab: 21 mmol/L — ABNORMAL LOW (ref 22–29)
Lab: 3.6 g/dL (ref 3.5–5.0)
Lab: 4.1 mmol/L (ref 3.5–5.1)
Lab: 5.3 g/dL — ABNORMAL LOW (ref 6.4–8.3)
Lab: 82 U/L (ref 40–150)

## 2017-09-03 LAB — CBC AND DIFF
Lab: 1.3 10*3/uL (ref 0.9–5.1)
Lab: 11 g/dL — ABNORMAL LOW (ref 14.0–18.0)
Lab: 146 10*3/uL (ref 130–400)
Lab: 31 pg — ABNORMAL HIGH (ref 27.0–31.0)
Lab: 34 % — ABNORMAL LOW (ref 42.0–52.0)
Lab: 4.3 10*3/uL — ABNORMAL LOW (ref 4.8–10.8)
Lab: 93 fL (ref 80.0–99.0)

## 2017-09-03 LAB — PROTIME INR (PT)
Lab: 1.2 % (ref 11.5–14.5)
Lab: 13 s — ABNORMAL HIGH (ref 9.9–12.1)

## 2017-09-08 ENCOUNTER — Encounter: Admit: 2017-09-08 | Discharge: 2017-09-08

## 2017-09-08 DIAGNOSIS — K7031 Alcoholic cirrhosis of liver with ascites: Principal | ICD-10-CM

## 2017-09-08 LAB — CBC AND DIFF
Lab: 1 %
Lab: 2 %
Lab: 25 %
Lab: 50 %

## 2017-09-09 ENCOUNTER — Encounter: Admit: 2017-09-09 | Discharge: 2017-09-09

## 2017-09-09 DIAGNOSIS — K7031 Alcoholic cirrhosis of liver with ascites: Principal | ICD-10-CM

## 2017-09-10 ENCOUNTER — Encounter: Admit: 2017-09-10 | Discharge: 2017-09-10

## 2017-09-10 DIAGNOSIS — K7031 Alcoholic cirrhosis of liver with ascites: Principal | ICD-10-CM

## 2017-09-10 LAB — COMPREHENSIVE METABOLIC PANEL
Lab: 1.2 — ABNORMAL HIGH (ref 0.72–1.25)
Lab: 67
Lab: 73
Lab: 11 — ABNORMAL HIGH (ref 0.0–6.0)
Lab: 113 — ABNORMAL HIGH (ref 98–107)
Lab: 143
Lab: 16
Lab: 19
Lab: 2.1 — ABNORMAL HIGH (ref 0.0–1.0)
Lab: 23 — ABNORMAL HIGH (ref 0.0–10.0)
Lab: 3.7
Lab: 3.9
Lab: 33
Lab: 5.4 — ABNORMAL LOW (ref 6.4–8.3)
Lab: 9
Lab: 91

## 2017-09-10 LAB — PROTIME INR (PT)
Lab: 1.3 — ABNORMAL HIGH (ref 27.0–31.0)
Lab: 14 — ABNORMAL HIGH (ref 9.9–12.1)

## 2017-09-10 LAB — CBC AND DIFF
Lab: 3.5 — ABNORMAL LOW (ref 4.70–6.10)
Lab: 34
Lab: 4 — ABNORMAL LOW (ref 4.8–10.8)
Lab: 11 — ABNORMAL LOW (ref 14.0–18.0)
Lab: 13
Lab: 33 — ABNORMAL LOW (ref 42.0–52.0)

## 2017-09-23 ENCOUNTER — Encounter: Admit: 2017-09-23 | Discharge: 2017-09-23

## 2017-09-27 ENCOUNTER — Encounter: Admit: 2017-09-27 | Discharge: 2017-09-27

## 2017-10-01 ENCOUNTER — Encounter: Admit: 2017-10-01 | Discharge: 2017-10-01

## 2017-10-01 DIAGNOSIS — K7031 Alcoholic cirrhosis of liver with ascites: Principal | ICD-10-CM

## 2017-10-01 LAB — CBC AND DIFF
Lab: 12 — ABNORMAL LOW (ref 14.0–18.0)
Lab: 4.5 — ABNORMAL LOW (ref 4.8–10.8)
Lab: 96
Lab: 0.1 MMOL/L (ref 21–30)
Lab: 0.3 U/L — ABNORMAL HIGH (ref 7–40)
Lab: 0.4 U/L (ref 25–110)
Lab: 1.2 g/dL (ref 3.5–5.0)
Lab: 1.5 g/dL (ref 6.0–8.0)
Lab: 13 MMOL/L — ABNORMAL LOW (ref 3.5–5.1)
Lab: 148 MMOL/L — ABNORMAL LOW (ref 98–110)
Lab: 2.5 mg/dL (ref 0.3–1.2)
Lab: 26 mg/dL (ref 7–25)
Lab: 3.8 — ABNORMAL LOW (ref 4.70–6.10)
Lab: 32 10*3/uL — ABNORMAL HIGH (ref 27.0–31.0)
Lab: 33 MMOL/L — ABNORMAL LOW (ref 137–147)
Lab: 37 — ABNORMAL LOW (ref 42.0–52.0)
Lab: 55 mg/dL (ref 70–100)
Lab: 6.2 mg/dL — ABNORMAL HIGH (ref 0.0–6.0)
Lab: 9.7 mg/dL (ref 0.4–1.00)

## 2017-10-04 MED ORDER — TRAZODONE 50 MG PO TAB
50 mg | ORAL_TABLET | Freq: Every evening | ORAL | 2 refills | Status: AC | PRN
Start: 2017-10-04 — End: 2017-10-27

## 2017-10-07 ENCOUNTER — Encounter: Admit: 2017-10-07 | Discharge: 2017-10-07

## 2017-10-17 ENCOUNTER — Encounter: Admit: 2017-10-17 | Discharge: 2017-10-17

## 2017-10-18 MED ORDER — MIDODRINE 10 MG PO TAB
ORAL_TABLET | Freq: Three times a day (TID) | 1 refills | Status: AC
Start: 2017-10-18 — End: 2018-01-25

## 2017-10-20 ENCOUNTER — Encounter: Admit: 2017-10-20 | Discharge: 2017-10-20

## 2017-10-27 ENCOUNTER — Encounter: Admit: 2017-10-27 | Discharge: 2017-10-27

## 2017-10-27 MED ORDER — TRAZODONE 50 MG PO TAB
50 mg | ORAL_TABLET | Freq: Every evening | ORAL | 5 refills | Status: AC | PRN
Start: 2017-10-27 — End: 2018-01-11

## 2017-11-08 ENCOUNTER — Encounter: Admit: 2017-11-08 | Discharge: 2017-11-08

## 2017-11-08 DIAGNOSIS — K7031 Alcoholic cirrhosis of liver with ascites: Principal | ICD-10-CM

## 2017-11-08 LAB — COMPREHENSIVE METABOLIC PANEL
Lab: 13
Lab: 136 — ABNORMAL HIGH (ref 70–105)
Lab: 143
Lab: 23
Lab: 28
Lab: 4.1
Lab: 1.2
Lab: 1.3 — ABNORMAL HIGH (ref 0.0–1.0)
Lab: 111 — ABNORMAL HIGH (ref 98–107)
Lab: 14
Lab: 3.6
Lab: 34
Lab: 69
Lab: 8.9
Lab: 94

## 2017-11-08 LAB — PROTIME INR (PT)
Lab: 14 — ABNORMAL HIGH (ref 9.9–12.1)
Lab: 1.3

## 2017-11-10 ENCOUNTER — Encounter: Admit: 2017-11-10 | Discharge: 2017-11-10

## 2017-11-10 MED ORDER — CHOLECALCIFEROL (VITAMIN D3) 50,000 UNIT PO CAP
ORAL_CAPSULE | 0 refills
Start: 2017-11-10 — End: ?

## 2017-11-17 ENCOUNTER — Encounter: Admit: 2017-11-17 | Discharge: 2017-11-17

## 2017-11-18 ENCOUNTER — Encounter: Admit: 2017-11-18 | Discharge: 2017-11-18

## 2017-11-19 ENCOUNTER — Encounter: Admit: 2017-11-19 | Discharge: 2017-11-19

## 2017-11-24 ENCOUNTER — Encounter: Admit: 2017-11-24 | Discharge: 2017-11-24

## 2017-11-24 DIAGNOSIS — K7031 Alcoholic cirrhosis of liver with ascites: Principal | ICD-10-CM

## 2017-11-24 LAB — COMPREHENSIVE METABOLIC PANEL
Lab: 1.1
Lab: 128 — ABNORMAL HIGH (ref 70–105)
Lab: 3.6
Lab: 1.1 — ABNORMAL HIGH (ref 0.0–1.0)
Lab: 112 — ABNORMAL HIGH (ref 98–107)
Lab: 13
Lab: 142
Lab: 15
Lab: 22
Lab: 4.7 — ABNORMAL HIGH (ref 27.0–31.0)
Lab: 5.8 — ABNORMAL LOW (ref 6.4–8.3)
Lab: 52
Lab: 76 — ABNORMAL HIGH (ref 0.0–10.0)
Lab: 8.8
Lab: 86 — ABNORMAL HIGH (ref 5–34)
Lab: 94

## 2017-11-24 LAB — PROTIME INR (PT)
Lab: 1.3 — ABNORMAL LOW (ref 14.0–18.0)
Lab: 14 — ABNORMAL HIGH (ref 9.9–12.1)

## 2017-11-24 LAB — CBC AND DIFF
Lab: 35 — ABNORMAL LOW (ref 42.0–52.0)
Lab: 5.7

## 2017-12-09 ENCOUNTER — Encounter: Admit: 2017-12-09 | Discharge: 2017-12-09

## 2017-12-09 DIAGNOSIS — K7031 Alcoholic cirrhosis of liver with ascites: Principal | ICD-10-CM

## 2017-12-09 LAB — CBC AND DIFF
Lab: 1.1
Lab: 0.2
Lab: 1.5 — ABNORMAL LOW (ref 6.4–8.3)
Lab: 12 — ABNORMAL LOW (ref 14.0–18.0)
Lab: 13 — ABNORMAL HIGH (ref 70–105)
Lab: 3.7 — ABNORMAL LOW (ref 4.70–6.10)
Lab: 32 — ABNORMAL HIGH (ref 27.0–31.0)
Lab: 33
Lab: 36 — ABNORMAL LOW (ref 42.0–52.0)
Lab: 4.1 — ABNORMAL LOW (ref 4.8–10.8)
Lab: 58 — ABNORMAL HIGH (ref 0.0–1.0)
Lab: 6.6
Lab: 97

## 2017-12-09 LAB — PROTIME INR (PT): Lab: 14 — ABNORMAL HIGH (ref 9.9–12.1)

## 2017-12-09 LAB — COMPREHENSIVE METABOLIC PANEL: Lab: 140

## 2017-12-14 ENCOUNTER — Encounter: Admit: 2017-12-14 | Discharge: 2017-12-14

## 2017-12-14 MED ORDER — RIFAXIMIN 550 MG PO TAB
550 mg | ORAL_TABLET | Freq: Two times a day (BID) | ORAL | 3 refills | 30.00000 days | Status: AC
Start: 2017-12-14 — End: 2018-04-01

## 2017-12-15 ENCOUNTER — Encounter: Admit: 2017-12-15 | Discharge: 2017-12-15

## 2017-12-15 DIAGNOSIS — K7031 Alcoholic cirrhosis of liver with ascites: Principal | ICD-10-CM

## 2017-12-15 LAB — CBC AND DIFF
Lab: 0.1 (ref 0.0–0.2)
Lab: 2.1 (ref 1.9–8.1)
Lab: 8.5 (ref 0.0–10.0)
Lab: 8.8 — ABNORMAL HIGH (ref 0.0–6.0)
Lab: 0.3 (ref 0.1–0.9)
Lab: 0.4 (ref 0.0–0.6)
Lab: 1.2 10*3/uL (ref 0.9–5.1)
Lab: 1.7 (ref 0.0–2.0)
Lab: 12 g/dL — ABNORMAL LOW (ref >59)
Lab: 153 (ref 130–400)
Lab: 28 g/dL — ABNORMAL LOW (ref 18.0–47.0)
Lab: 3.8 mg/dL — ABNORMAL LOW (ref 4.70–6.10)
Lab: 32 pg — ABNORMAL HIGH (ref 27.0–31.0)
Lab: 37 % — ABNORMAL LOW (ref 42.0–52.0)
Lab: 4 uL — ABNORMAL LOW (ref 4.8–10.8)
Lab: 52 U/L (ref 40.0–75.0)

## 2017-12-15 LAB — COMPREHENSIVE METABOLIC PANEL
Lab: 10 (ref 0–14)
Lab: 142 mmol/L (ref 136–145)
Lab: 25 mmol/L (ref 22–29)
Lab: 4.2 mmol/L (ref 3.5–5.1)

## 2017-12-15 LAB — PROTIME INR (PT)
Lab: 1.2 % — ABNORMAL HIGH (ref 11.5–14.5)
Lab: 13 s — ABNORMAL HIGH (ref 9.9–12.1)

## 2017-12-20 ENCOUNTER — Encounter: Admit: 2017-12-20 | Discharge: 2017-12-20

## 2017-12-30 ENCOUNTER — Encounter: Admit: 2017-12-30 | Discharge: 2017-12-30

## 2017-12-30 DIAGNOSIS — K7031 Alcoholic cirrhosis of liver with ascites: Principal | ICD-10-CM

## 2017-12-30 LAB — CBC AND DIFF
Lab: 0.3
Lab: 1.1
Lab: 1.4 — ABNORMAL LOW (ref 6.4–8.3)
Lab: 13 — ABNORMAL HIGH (ref 70–105)
Lab: 13 — ABNORMAL LOW (ref 14.0–18)
Lab: 180
Lab: 22 — ABNORMAL HIGH (ref 5–34)
Lab: 3.1
Lab: 31 — ABNORMAL HIGH (ref 27.0–31.0)
Lab: 33
Lab: 4.3 — ABNORMAL LOW (ref 4.70–6.10)
Lab: 4.9 (ref 4.8–10.8)
Lab: 41 — ABNORMAL LOW (ref 42.0–52.0)
Lab: 5.7
Lab: 62 — ABNORMAL HIGH (ref 0.0–1.0)
Lab: 7.2
Lab: 96

## 2017-12-30 LAB — PROTIME INR (PT): Lab: 13 — ABNORMAL HIGH (ref 9.9–12.1)

## 2017-12-30 LAB — COMPREHENSIVE METABOLIC PANEL: Lab: 143

## 2018-01-04 ENCOUNTER — Encounter: Admit: 2018-01-04 | Discharge: 2018-01-04

## 2018-01-07 ENCOUNTER — Encounter: Admit: 2018-01-07 | Discharge: 2018-01-07

## 2018-01-07 DIAGNOSIS — K7031 Alcoholic cirrhosis of liver with ascites: Secondary | ICD-10-CM

## 2018-01-07 LAB — CBC AND DIFF
Lab: 32 — ABNORMAL HIGH (ref 27.0–31.0)
Lab: 12 — ABNORMAL HIGH (ref 70–105)
Lab: 13 — ABNORMAL LOW (ref 14.0–18.0)
Lab: 175
Lab: 26 — ABNORMAL HIGH (ref 5–34)
Lab: 34
Lab: 38 — ABNORMAL LOW (ref 42.0–52.0)
Lab: 4 — ABNORMAL LOW (ref 4.70–6.10)
Lab: 4.6 — ABNORMAL LOW (ref 4.8–10.8)
Lab: 57 — ABNORMAL HIGH (ref 0.0–1.0)
Lab: 6.5 — ABNORMAL HIGH (ref 0.0–6.0)
Lab: 8.5
Lab: 94

## 2018-01-07 LAB — PROTIME INR (PT)
Lab: 1.3
Lab: 14 — ABNORMAL HIGH (ref 9.9–12.1)

## 2018-01-07 LAB — COMPREHENSIVE METABOLIC PANEL: Lab: 139

## 2018-01-11 ENCOUNTER — Encounter: Admit: 2018-01-11 | Discharge: 2018-01-11

## 2018-01-11 ENCOUNTER — Ambulatory Visit: Admit: 2018-01-11 | Discharge: 2018-01-11 | Payer: MEDICAID

## 2018-01-11 DIAGNOSIS — K7031 Alcoholic cirrhosis of liver with ascites: Secondary | ICD-10-CM

## 2018-01-11 DIAGNOSIS — F1020 Alcohol dependence, uncomplicated: Secondary | ICD-10-CM

## 2018-01-11 DIAGNOSIS — K7460 Unspecified cirrhosis of liver: Secondary | ICD-10-CM

## 2018-01-11 LAB — MISC REFERENCE TEST

## 2018-01-11 LAB — 25-OH VITAMIN D (D2 + D3): Lab: 22 ng/mL — ABNORMAL LOW (ref 30–80)

## 2018-01-11 LAB — ALPHA FETO PROTEIN (AFP): Lab: 4.7 ng/mL (ref 0.0–15.0)

## 2018-01-11 MED ORDER — SPIRONOLACTONE 50 MG PO TAB
50 mg | ORAL_TABLET | Freq: Every day | ORAL | 2 refills | 46.00000 days | Status: AC
Start: 2018-01-11 — End: 2018-02-23

## 2018-01-11 MED ORDER — CIPROFLOXACIN HCL 500 MG PO TAB
500 mg | ORAL_TABLET | Freq: Every day | ORAL | 1 refills | 10.00000 days | Status: AC
Start: 2018-01-11 — End: 2018-08-01

## 2018-01-11 MED ORDER — FUROSEMIDE 40 MG PO TAB
40 mg | ORAL_TABLET | Freq: Every morning | ORAL | 2 refills | 90.00000 days | Status: AC
Start: 2018-01-11 — End: 2018-02-23

## 2018-01-11 MED ORDER — TRAZODONE 50 MG PO TAB
50-100 mg | ORAL_TABLET | Freq: Every evening | ORAL | 5 refills | Status: AC | PRN
Start: 2018-01-11 — End: 2018-06-08

## 2018-01-12 LAB — NICOTINE & COTININE LEVEL

## 2018-01-13 ENCOUNTER — Encounter: Admit: 2018-01-13 | Discharge: 2018-01-13

## 2018-01-13 MED ORDER — CHOLECALCIFEROL (VITAMIN D3) 50,000 UNIT PO CAP
50000 [IU] | ORAL_CAPSULE | ORAL | 0 refills | 84.00000 days | Status: AC
Start: 2018-01-13 — End: 2018-04-04

## 2018-01-14 ENCOUNTER — Encounter: Admit: 2018-01-14 | Discharge: 2018-01-14

## 2018-01-14 DIAGNOSIS — K7460 Unspecified cirrhosis of liver: Secondary | ICD-10-CM

## 2018-01-14 DIAGNOSIS — F1020 Alcohol dependence, uncomplicated: Secondary | ICD-10-CM

## 2018-01-14 LAB — VITAMIN A: Lab: 15 — ABNORMAL LOW

## 2018-01-14 MED ORDER — VITAMIN A 10,000 UNIT PO CAP
10000 [IU] | ORAL_CAPSULE | Freq: Every day | ORAL | 3 refills | Status: AC
Start: 2018-01-14 — End: 2018-08-04

## 2018-01-17 LAB — MISC ARUP TEST
Lab: 9
Lab: 201

## 2018-01-18 ENCOUNTER — Encounter: Admit: 2018-01-18 | Discharge: 2018-01-18

## 2018-01-21 ENCOUNTER — Encounter: Admit: 2018-01-21 | Discharge: 2018-01-21

## 2018-01-24 ENCOUNTER — Encounter: Admit: 2018-01-24 | Discharge: 2018-01-24

## 2018-01-25 ENCOUNTER — Encounter: Admit: 2018-01-25 | Discharge: 2018-01-25

## 2018-01-25 DIAGNOSIS — K7031 Alcoholic cirrhosis of liver with ascites: Secondary | ICD-10-CM

## 2018-01-25 LAB — COMPREHENSIVE METABOLIC PANEL
Lab: 4
Lab: 142

## 2018-01-25 LAB — CBC AND DIFF
Lab: 0.1
Lab: 0.3
Lab: 0.5
Lab: 1.2
Lab: 4.5 — ABNORMAL LOW (ref 4.8–10.8)
Lab: 1.9
Lab: 10 — ABNORMAL HIGH (ref 0.0–10.0)
Lab: 12
Lab: 13 — ABNORMAL LOW (ref 14.0–18.0)
Lab: 174 — ABNORMAL HIGH (ref 0.0–1.0)
Lab: 27
Lab: 32 — ABNORMAL HIGH (ref 27.0–31.0)
Lab: 34
Lab: 39 — ABNORMAL LOW (ref 42.0–52.0)
Lab: 4.2 — ABNORMAL LOW (ref 4.70–6.10)
Lab: 54 — ABNORMAL HIGH (ref 5–34)
Lab: 93 — ABNORMAL HIGH (ref 0.72–1.25)

## 2018-01-25 LAB — PROTIME INR (PT)
Lab: 1.3
Lab: 14 — ABNORMAL HIGH (ref 9.9–12.1)

## 2018-01-25 MED ORDER — MIDODRINE 10 MG PO TAB
ORAL_TABLET | Freq: Three times a day (TID) | 2 refills | Status: AC
Start: 2018-01-25 — End: 2018-04-25

## 2018-01-27 ENCOUNTER — Encounter: Admit: 2018-01-27 | Discharge: 2018-01-27

## 2018-02-07 ENCOUNTER — Encounter: Admit: 2018-02-07 | Discharge: 2018-02-07

## 2018-02-11 ENCOUNTER — Encounter: Admit: 2018-02-11 | Discharge: 2018-02-11

## 2018-02-17 ENCOUNTER — Encounter: Admit: 2018-02-17 | Discharge: 2018-02-17

## 2018-02-21 ENCOUNTER — Encounter: Admit: 2018-02-21 | Discharge: 2018-02-21

## 2018-02-21 DIAGNOSIS — K7031 Alcoholic cirrhosis of liver with ascites: Principal | ICD-10-CM

## 2018-02-21 LAB — COMPREHENSIVE METABOLIC PANEL
Lab: 13
Lab: 51
Lab: 7.2
Lab: 1.4 mg/dL — ABNORMAL HIGH (ref 0.0–1.0)
Lab: 1.6 mg/dL — ABNORMAL HIGH (ref 0.72–1.25)
Lab: 106
Lab: 106 U/L
Lab: 138 mmol/L
Lab: 21 mg/dL — ABNORMAL HIGH (ref 8.9–20.6)
Lab: 23 mmol/L
Lab: 3.8 g/dL
Lab: 38
Lab: 4.2 mmol/L
Lab: 43 U/L — ABNORMAL HIGH (ref 5–34)

## 2018-02-21 LAB — PROTIME INR (PT): Lab: 14 s — ABNORMAL HIGH (ref 9.9–12.6)

## 2018-02-22 ENCOUNTER — Encounter: Admit: 2018-02-22 | Discharge: 2018-02-22

## 2018-02-22 DIAGNOSIS — K7031 Alcoholic cirrhosis of liver with ascites: Principal | ICD-10-CM

## 2018-02-22 LAB — CBC AND DIFF
Lab: 0.3
Lab: 13
Lab: 176
Lab: 2.8
Lab: 25
Lab: 32 — ABNORMAL HIGH (ref 27.0–31.0)
Lab: 5.3
Lab: 59
Lab: 90
Lab: 0.1
Lab: 0.3
Lab: 1.2
Lab: 14 10*3/uL (ref 0–0.45)
Lab: 2.3 — ABNORMAL HIGH (ref 0.0–2.0)
Lab: 35 — ABNORMAL HIGH (ref 30.0–34.0)
Lab: 39 10*3/uL — ABNORMAL LOW (ref 42.0–52.0)
Lab: 4.3 10*3/uL — ABNORMAL LOW (ref 4.70–6.10)
Lab: 4.7 10*3/uL — ABNORMAL LOW (ref 4.8–10.8)
Lab: 7.3

## 2018-02-23 MED ORDER — FUROSEMIDE 40 MG PO TAB
20 mg | ORAL_TABLET | Freq: Every morning | ORAL | 2 refills | 90.00000 days | Status: AC
Start: 2018-02-23 — End: 2018-04-04

## 2018-02-23 MED ORDER — SPIRONOLACTONE 50 MG PO TAB
25 mg | ORAL_TABLET | Freq: Every day | ORAL | 2 refills | 90.00000 days | Status: AC
Start: 2018-02-23 — End: 2018-04-04

## 2018-03-03 ENCOUNTER — Encounter: Admit: 2018-03-03 | Discharge: 2018-03-03

## 2018-03-24 ENCOUNTER — Encounter: Admit: 2018-03-24 | Discharge: 2018-03-24

## 2018-03-24 MED ORDER — RIFAXIMIN 550 MG PO TAB
550 mg | ORAL_TABLET | Freq: Two times a day (BID) | ORAL | 3 refills | Status: CN
Start: 2018-03-24 — End: ?

## 2018-03-25 ENCOUNTER — Encounter: Admit: 2018-03-25 | Discharge: 2018-03-25

## 2018-03-25 NOTE — Telephone Encounter
Standing lab orders printed and faxed to number provided for pt lab.

## 2018-03-30 ENCOUNTER — Encounter: Admit: 2018-03-30 | Discharge: 2018-03-30

## 2018-03-30 DIAGNOSIS — K7031 Alcoholic cirrhosis of liver with ascites: Principal | ICD-10-CM

## 2018-03-30 LAB — CBC AND DIFF
Lab: 31 — ABNORMAL HIGH (ref 27.0–31.0)
Lab: 37 — ABNORMAL LOW (ref 42.0–52.0)
Lab: 4.1 — ABNORMAL LOW (ref 4.70–6.10)
Lab: 4.8 (ref 4.8–10.8)
Lab: 13 — ABNORMAL LOW (ref 14.0–18.0)
Lab: 34 — ABNORMAL HIGH (ref 30.0–34.0)
Lab: 91 (ref 80.0–99.0)

## 2018-03-30 LAB — COMPREHENSIVE METABOLIC PANEL
Lab: 1.1 mg/dL — ABNORMAL LOW (ref 137–147)
Lab: 1.4 mg/dL — ABNORMAL HIGH (ref 0.72–1.25)
Lab: 111 U/L — ABNORMAL HIGH (ref 98–107)
Lab: 121 mL/min — ABNORMAL HIGH (ref >60)
Lab: 142 mmol/L (ref 0.3–1.2)
Lab: 15 MMOL/L — ABNORMAL HIGH (ref 0–14)
Lab: 17 mg/dL (ref 7–56)
Lab: 20 mmol/L — ABNORMAL LOW (ref 22–29)
Lab: 3.8 mg/dL — ABNORMAL HIGH (ref 0.4–1.00)
Lab: 4.1 mmol/L — ABNORMAL HIGH (ref 3.5–5.0)
Lab: 44 U/L — ABNORMAL HIGH (ref 5–34)
Lab: 46 U/L — ABNORMAL LOW (ref 98–110)
Lab: 58 mL/min — ABNORMAL LOW (ref >60)
Lab: 6.7 g/dL (ref 7–25)
Lab: 9.5 mg/dL (ref 4.5–11.0)

## 2018-03-30 LAB — PROTIME INR (PT)
Lab: 1.3
Lab: 13 s — ABNORMAL HIGH (ref 9.9–12.6)

## 2018-04-01 ENCOUNTER — Encounter: Admit: 2018-04-01 | Discharge: 2018-04-01

## 2018-04-01 MED ORDER — RIFAXIMIN 550 MG PO TAB
550 mg | ORAL_TABLET | Freq: Two times a day (BID) | ORAL | 3 refills | 30.00000 days | Status: AC
Start: 2018-04-01 — End: 2018-08-22

## 2018-04-02 ENCOUNTER — Encounter: Admit: 2018-04-02 | Discharge: 2018-04-02

## 2018-04-04 ENCOUNTER — Encounter: Admit: 2018-04-04 | Discharge: 2018-04-04

## 2018-04-04 MED ORDER — FUROSEMIDE 40 MG PO TAB
ORAL_TABLET | Freq: Every day | ORAL | 0 refills | 90.00000 days | Status: AC
Start: 2018-04-04 — End: 2018-05-19

## 2018-04-04 MED ORDER — SPIRONOLACTONE 25 MG PO TAB
25 mg | ORAL_TABLET | Freq: Every day | ORAL | 2 refills | 46.00000 days | Status: AC
Start: 2018-04-04 — End: 2018-07-13

## 2018-04-04 MED ORDER — CHOLECALCIFEROL (VITAMIN D3) 1,250 MCG (50,000 UNIT) PO CAP
ORAL_CAPSULE | ORAL | 0 refills | 84.00000 days | Status: AC
Start: 2018-04-04 — End: 2018-08-03

## 2018-04-07 ENCOUNTER — Encounter: Admit: 2018-04-07 | Discharge: 2018-04-07

## 2018-04-07 MED ORDER — FUROSEMIDE 40 MG PO TAB
ORAL_TABLET | Freq: Every day | 0 refills
Start: 2018-04-07 — End: ?

## 2018-04-09 ENCOUNTER — Encounter: Admit: 2018-04-09 | Discharge: 2018-04-09

## 2018-04-09 NOTE — Telephone Encounter
Wife reports temperature of 99.9 with associated cough that started yesterday with chest tightness and green sputum. Denies SOA. Denies recent travel or being exposed to others with potential illness. Denies any other symptoms other than generally not feeling well.  Advised to take tylenol up to 2,000 mg per day for fever and closely monitor symptoms. If they progress or worrsen and pt feels SOA he should proceed promptly to the ER. Advised they should call before coming to alert whatever ER wthey are coming due to respiratory concerns and what car they are coming in. Wife also f/u she won't be able to come into hospital.

## 2018-04-21 ENCOUNTER — Encounter: Admit: 2018-04-21 | Discharge: 2018-04-21

## 2018-04-21 DIAGNOSIS — R188 Other ascites: Principal | ICD-10-CM

## 2018-04-22 NOTE — Telephone Encounter
VM 10:26a They did not receive fax. Please fax again. Fax number is correct.

## 2018-04-23 ENCOUNTER — Encounter: Admit: 2018-04-23 | Discharge: 2018-04-23

## 2018-04-25 MED ORDER — MIDODRINE 10 MG PO TAB
ORAL_TABLET | Freq: Three times a day (TID) | 0 refills | Status: DC
Start: 2018-04-25 — End: 2018-08-01

## 2018-04-28 ENCOUNTER — Encounter: Admit: 2018-04-28 | Discharge: 2018-04-28

## 2018-05-09 ENCOUNTER — Encounter: Admit: 2018-05-09 | Discharge: 2018-05-09

## 2018-05-18 ENCOUNTER — Encounter: Admit: 2018-05-18 | Discharge: 2018-05-18

## 2018-05-19 MED ORDER — ONDANSETRON HCL 4 MG PO TAB
ORAL_TABLET | Freq: Three times a day (TID) | 2 refills | 8.00000 days | Status: AC | PRN
Start: 2018-05-19 — End: ?

## 2018-05-19 MED ORDER — OPTIMAL D3 1,250 MCG (50,000 UNIT) PO CAP
ORAL_CAPSULE | 0 refills
Start: 2018-05-19 — End: ?

## 2018-05-19 MED ORDER — FUROSEMIDE 40 MG PO TAB
ORAL_TABLET | Freq: Every day | ORAL | 2 refills | 90.00000 days | Status: DC
Start: 2018-05-19 — End: 2018-08-01

## 2018-05-23 ENCOUNTER — Encounter: Admit: 2018-05-23 | Discharge: 2018-05-23

## 2018-05-30 ENCOUNTER — Encounter: Admit: 2018-05-30 | Discharge: 2018-05-30

## 2018-05-31 MED ORDER — OPTIMAL D3 1,250 MCG (50,000 UNIT) PO CAP
ORAL_CAPSULE | 0 refills
Start: 2018-05-31 — End: ?

## 2018-06-03 ENCOUNTER — Encounter: Admit: 2018-06-03 | Discharge: 2018-06-03

## 2018-06-03 DIAGNOSIS — K7031 Alcoholic cirrhosis of liver with ascites: Principal | ICD-10-CM

## 2018-06-03 LAB — COMPREHENSIVE METABOLIC PANEL
Lab: 237 — ABNORMAL HIGH (ref 70–105)
Lab: 3.9 (ref 3.5–5.0)
Lab: 6.8 (ref 6.4–8.3)
Lab: 0.9 mg/dL (ref 0.2–1.2)
Lab: 1.5 mg/dL — ABNORMAL HIGH (ref 0.72–1.25)
Lab: 140 mmol/L (ref 136–145)
Lab: 25 mmol/L (ref 22–29)
Lab: 37 U/L (ref 0–55)
Lab: 37 U/L — ABNORMAL HIGH (ref 5–34)
Lab: 4.2 mmol/L (ref 3.5–5.1)
Lab: 53 — ABNORMAL LOW (ref >59)
Lab: 9.4 mg/dL (ref 8.4–10.2)
Lab: 95 U/L (ref 40–150)

## 2018-06-03 LAB — CBC AND DIFF
Lab: 0.3
Lab: 0.1
Lab: 0.4
Lab: 1.5 10*3/uL (ref 0.9–5.1)
Lab: 12 % (ref 11.5–14.5)
Lab: 14 g/dL (ref 14.0–18)
Lab: 167 % (ref >60)
Lab: 2 10*3/uL (ref 0.0–2.0)
Lab: 27 10*3/uL (ref 18.0–47.0)
Lab: 3 (ref 1.9–8.1)
Lab: 31 pg — ABNORMAL HIGH (ref 27.0–31.0)
Lab: 34 g/dL — ABNORMAL HIGH (ref 30.0–34.0)
Lab: 4.4 uL — ABNORMAL LOW (ref 4.70–6.10)
Lab: 41 % — ABNORMAL LOW (ref 42.0–52.0)
Lab: 5.4 x10-3/uL (ref 4.8–10.8)
Lab: 56 K/UL — ABNORMAL LOW (ref >60)
Lab: 6.2 K/UL — ABNORMAL HIGH (ref 0.0–6.0)
Lab: 7.9 K/UL (ref 0.0–10.0)
Lab: 93 % (ref 80.0–99.0)

## 2018-06-03 LAB — PROTIME INR (PT): Lab: 12 s (ref 9.9–12.6)

## 2018-06-08 ENCOUNTER — Encounter: Admit: 2018-06-08 | Discharge: 2018-06-08

## 2018-06-08 MED ORDER — TRAZODONE 50 MG PO TAB
ORAL_TABLET | Freq: Every day | 5 refills | Status: AC | PRN
Start: 2018-06-08 — End: ?

## 2018-06-23 ENCOUNTER — Encounter: Admit: 2018-06-23 | Discharge: 2018-06-23

## 2018-06-23 NOTE — Telephone Encounter
Pt wants to know about labs done and labs that should be done before next appt.

## 2018-06-24 NOTE — Telephone Encounter
Left pt a message. Advised released labs in [place]. MELD was 13.  Advised will repeat labs in July at [facility] to update things like vitamin levels and AFP.  Left my call back # if questions.

## 2018-07-12 ENCOUNTER — Encounter: Admit: 2018-07-12 | Discharge: 2018-07-12

## 2018-07-13 MED ORDER — SPIRONOLACTONE 25 MG PO TAB
ORAL_TABLET | Freq: Every day | ORAL | 1 refills | 46.00000 days | Status: DC
Start: 2018-07-13 — End: 2019-02-02

## 2018-07-20 ENCOUNTER — Encounter: Admit: 2018-07-20 | Discharge: 2018-07-20

## 2018-07-20 NOTE — Telephone Encounter
VM 2:21pm  Dr Jackie Plum, MD, [place], calling to discuss pt care, including a new diagnosis. Please call Dr Shon Hough [phone].

## 2018-07-21 NOTE — Telephone Encounter
Dr. Tedd Sias states pt has been diagnosed with type 2 DM. He will start pt on glimepiride 2-4 mg po qday.  Hgb A1C 7.4  Discussed our cut off of 8 for transplant.  He v/u.

## 2018-07-26 ENCOUNTER — Encounter: Admit: 2018-07-26 | Discharge: 2018-07-26

## 2018-07-26 NOTE — Telephone Encounter
In person appt on 7/28 switched from 11:20am to 10am for scheduling conflict. Pt notified and v/u.

## 2018-07-29 ENCOUNTER — Encounter: Admit: 2018-07-29 | Discharge: 2018-07-29

## 2018-07-29 NOTE — Telephone Encounter
Called pt, scheduled OLT eval update for 7/28.

## 2018-08-01 MED ORDER — MIDODRINE 10 MG PO TAB
ORAL_TABLET | Freq: Three times a day (TID) | 1 refills | Status: DC
Start: 2018-08-01 — End: 2018-08-02

## 2018-08-01 MED ORDER — CIPROFLOXACIN HCL 500 MG PO TAB
ORAL_TABLET | Freq: Every day | ORAL | 1 refills | 10.00000 days | Status: DC
Start: 2018-08-01 — End: 2018-08-03

## 2018-08-01 MED ORDER — FUROSEMIDE 40 MG PO TAB
ORAL_TABLET | Freq: Every day | ORAL | 1 refills | 90.00000 days | Status: DC
Start: 2018-08-01 — End: 2018-08-02

## 2018-08-02 ENCOUNTER — Ambulatory Visit: Admit: 2018-08-02 | Discharge: 2018-08-02

## 2018-08-02 ENCOUNTER — Encounter: Admit: 2018-08-02 | Discharge: 2018-08-02

## 2018-08-02 ENCOUNTER — Ambulatory Visit: Admit: 2018-08-02 | Discharge: 2018-08-03

## 2018-08-02 DIAGNOSIS — K7031 Alcoholic cirrhosis of liver with ascites: Secondary | ICD-10-CM

## 2018-08-02 DIAGNOSIS — I8500 Esophageal varices without bleeding: Secondary | ICD-10-CM

## 2018-08-02 DIAGNOSIS — F1020 Alcohol dependence, uncomplicated: Secondary | ICD-10-CM

## 2018-08-02 DIAGNOSIS — K7460 Unspecified cirrhosis of liver: Secondary | ICD-10-CM

## 2018-08-02 LAB — COMPREHENSIVE METABOLIC PANEL
Lab: 1.5 mg/dL — ABNORMAL HIGH (ref 0.3–1.2)
Lab: 1.5 mg/dL — ABNORMAL HIGH (ref 0.4–1.24)
Lab: 11 K/UL (ref 3–12)
Lab: 141 MMOL/L (ref 137–147)
Lab: 147 mg/dL — ABNORMAL HIGH (ref 70–100)
Lab: 25 MMOL/L (ref 21–30)
Lab: 4.1 MMOL/L (ref 3.5–5.1)
Lab: 4.9 g/dL (ref 3.5–5.0)
Lab: 51 mL/min — ABNORMAL LOW (ref >60)
Lab: 52 U/L — ABNORMAL HIGH (ref 7–40)
Lab: 8.2 g/dL — ABNORMAL HIGH (ref 6.0–8.0)
Lab: 90 U/L (ref 25–110)
Lab: >60 mL/min (ref >60)

## 2018-08-02 LAB — CBC AND DIFF
Lab: 0 10*3/uL (ref 0–0.20)
Lab: 4.6 M/UL (ref 4.4–5.5)
Lab: 5.9 10*3/uL (ref 4.5–11.0)

## 2018-08-02 LAB — ALPHA FETO PROTEIN (AFP): Lab: 3.9 ng/mL (ref 0.0–15.0)

## 2018-08-02 LAB — PROTIME INR (PT): Lab: 1.1 mg/dL — ABNORMAL HIGH (ref 0.8–1.2)

## 2018-08-02 MED ORDER — FUROSEMIDE 20 MG PO TAB
40 mg | ORAL_TABLET | Freq: Every morning | ORAL | 1 refills | 90.00000 days | Status: DC
Start: 2018-08-02 — End: 2019-02-20

## 2018-08-02 MED ORDER — MIDODRINE 5 MG PO TAB
5 mg | ORAL_TABLET | Freq: Three times a day (TID) | ORAL | 1 refills | Status: DC
Start: 2018-08-02 — End: 2019-03-27

## 2018-08-02 MED ORDER — PERFLUTREN LIPID MICROSPHERES 1.1 MG/ML IV SUSP
1-20 mL | Freq: Once | INTRAVENOUS | 0 refills | Status: CP | PRN
Start: 2018-08-02 — End: ?
  Administered 2018-08-02: 14:00:00 2 mL via INTRAVENOUS

## 2018-08-02 NOTE — Progress Notes
Procedure explained, questions answered. IV NS bubble study x 2 per standard without complications.

## 2018-08-02 NOTE — Telephone Encounter
Tristan Briggs met with Tristan Briggs for insurance consult.   There appear to be NO coverage/insurance barriers with [name].  Tristan Briggs expressed NO insurance concerns related to cost of post-transplant care and medications and verbalized understanding of [name]'s discussion and documents presented.  LISTING AUTH ON FILE    Documents presented:  Signed Patient Liability Form, Medication Cost Estimate, FINANCIAL ASSESSMENT UPDATE COMPLETED AND APPROVED WITH PT

## 2018-08-03 ENCOUNTER — Ambulatory Visit: Admit: 2018-09-15 | Discharge: 2018-09-15

## 2018-08-03 ENCOUNTER — Encounter: Admit: 2018-08-03 | Discharge: 2018-08-03

## 2018-08-03 DIAGNOSIS — K7460 Unspecified cirrhosis of liver: Secondary | ICD-10-CM

## 2018-08-03 DIAGNOSIS — I8500 Esophageal varices without bleeding: Secondary | ICD-10-CM

## 2018-08-03 DIAGNOSIS — F1020 Alcohol dependence, uncomplicated: Secondary | ICD-10-CM

## 2018-08-03 LAB — 25-OH VITAMIN D (D2 + D3): Lab: 44 ng/mL (ref 30–80)

## 2018-08-03 NOTE — Progress Notes
CHIEF COMPLAINT/PURPOSE FOR VISIT: Routine follow up patient with decompensated alcohol related cirrhosis.    HISTORY OF PRESENT ILLNESS: [name] is a very pleasant 38 year old gentleman who we follow for  decompensated alcohol-related liver disease.  [name] presented to [facility] with marked kidney dysfunction.  His renal function is significantly improved.      Overall [name] remains markedly improved as compared to his initial presentation.  He remains active on our transplant list.      We had restarted diuretics on his last visit and he is no longer requiring paracentesis.      REVIEW OF SYSTEMS: A 10-point review of systems is performed and it is notable only for persistent fatigue. He is requiring intermittent paracentesis.      PHYSICAL EXAMINATION:   GENERAL: The patient is chronically ill in appearance, but in no acute distress.   HEENT: Pupils are equal, round, and reactive.  EOMI.  There is mild scleral icterus.   RESPIRATORY: Equal air entry noted in all lung fields.  No adventitious sounds.   CARDIOVASCULAR: Heart, regular rate and rhythm.   ABDOMEN: Soft, non distended nontender.  EXTREMITIES: Full range of motion.  No clubbing or cyanosis.  There is mild lower extremity edema.    NEUROLOGIC: Normal exam.  No asterixis.  No tremor.     IMPRESSION/REPORT/PLAN:     #1.  Decompensated alcohol-related liver disease: [name] has a MELD-Na is most recently calculated at 2.  He is actively listed for OLT.  He was not approved for living donor at [facility].    #2.  Acute-on-chronic kidney injury RESOLVED: [name]'s renal function has dramatically improved.  At this time he will not need to be considered for [facility].    #3.  Ascites and edema. Controlled on furosemide 40 and spironolactone 50 mg once daily.  .      #4.  Hepatic encephalopathy:  Currently controlled on rifaximin and lactulose.  No changes made to his medication regimen. #5.  Screening for HCC:  US performed today was without worrisome lesion.    #6.   Esophageal varices:  Due for repeat EGD, this was ordered.      #7.  Followup: We would like to see [name] back in clinic in approximately  6 months or sooner if he has any worrisome symptom changes.

## 2018-08-04 LAB — VITAMIN A: Lab: 47 mg/dL (ref 8.5–10.6)

## 2018-08-05 ENCOUNTER — Encounter: Admit: 2018-08-05 | Discharge: 2018-08-05

## 2018-08-05 LAB — PHOSPHATIDYLETHANOL: Lab: NEGATIVE MMOL/L (ref 98–110)

## 2018-08-05 NOTE — Progress Notes
Social Work Psychosocial Assessment Update    Patient:  Tristan Briggs  MRN:  [id number]  Age:38    Hepatologist:  Dr. Rodena Piety  PCP: Wilford Grist    Presenting Situation:  Pt is a 38 yr old male who presents today with his wife, Lurena Joiner, to complete OLT Psychosocial Assessment. Pt was last seen by this SW, 01/11/18, for update.  Pt went through [facility] evaluation, 03/15/17, and is listed for OLT. Pt's liver disease was caused by alcohol. Pt denies new diagnosis or changes in health since SW last assessment. Pt has f/u as needed. Pt denies missing appointments. Pt states his last paracentesis was in November 2019.  Pt denies having any recent hospitalizations.      Home Environment: Pt continues to live in the same home with his wife, their two children, Rebecca's son Janyth Pupa, his girlfriend and their child, in [place].  There is one dog in the home for a pet. Pt is independent with ADL's and requires no assistance with mobility. Pt denies receiving HH services or stay in a SNF, LTACH or IRF since SW last assessment. Pt denies using a pill box and is responsible for his own medication management. Pt denies missing medications. Pt takes Xifaxin for HE.      Financial:  Pt is not working. He receives disability. Pt states he receives $1022/mo in SSDI.  Pt's disability is due to his liver disease. Pt's wife is employed for [employer]. Due to COVID she is currently furloughed. She states they plan to return to work October 2020. She states she has been receiving $920/wk in UC since March 2020.  She states it will go down to $320/wk starting next week unless a new stimulus package is approved.  Pt continues to have [facility] for insurance. He denies having a spend down. Pt met with TFA and denies having concerns regarding medication costs post transplant. Pt gets Rx filled at [facility] in [place], [place].       Support Structure:Pt denies having any changes in support. Pt continues to be married to Kalona.  Pt has 3???biological???children (youngest two are with Lurena Joiner)???and two step children that he considers his own; Harrold Donath, 20, never had contact, Gage, 16 and Genevieve, 13. ???Lurena Joiner has 4 children; Erie Noe, 23, and Statham, 20. Pt has two grandchildren.  Erie Noe continues to live in [place] and Janyth Pupa moved to [place] last year.  Pt's mother is living and in good health.???She lives in [place] with pt's step dad, [employer]. ???Pt states his mother provides care for his half sister who is autistic. ???Pt states his mother will not be part of his post transplant support. ???Pt's father is deceased.???Pt states his father died when he was young of a heroin overdose. ???Pt states he doesn't like talking about it. ???Pt has a half brother and two half sisters. Pt reports good support from his sister, Melissa. ???Monica lives in [place] and pt states she will take time off work and come to [place] and assist with pt's care. ???Lurena Joiner states she will be pt's primary caregiver. ???She states she will provide???24 hr???care post transplant. ???She states if she is unable to provide care, her friend, Annice Pih, will provide care. ???Lurena Joiner will provide transportation to the hospital once pt receives transplant call. ??????  ???  Caregiver Support  1.Lurena Joiner, spouse, [phone]  2.Clarisse Gouge, friend, [phone] (not working)  3.???Flint Melter, sister, [phone]     Cognitive Abilities:Pt was alert and oriented during assessment. Pt has  a general understanding of his medical condition and of the transplant process. Pt was able to verbalize risks and benefits of transplant. Pt denies having concerns regarding receiving a transplant. Pt hopes his life will be great post transplant. Pt was informed of the OLT support groups, [place] [facility] [place], and [facility] [employer] [facility].??????  ???  Pt is aware he will be required to take life long medication post transplant. Pt is aware of the frequent f/u at [facility], for both liver and renal transplant teams,???post transplant. Pt is aware he will be required to???stay locally for at least two weeks and have???24 hr care???and transportation assistance,???post transplant discharge. ???SW???reviewed Patient Responsibilities Form with pt and his wife. ???Both parties verbalized understanding of requirements. ???Copy of form was given to pt for his records. ?????????  ???  Pt denies Tobacco, drug or alcohol use since SW last assessment. Pt is no longer seeing a counselor or going to AA.     ???  Pt denies history of SI/SA, psychiatric hospitalizations or MH diagnosis.???Pt feels is his coping well at this time. ???  ???  Pt completed 11th grade. Pt is considering going back to school.  Pt denies ever being in the [employer]. Pt enjoys drawing,???painting, listening to music, wood working, and spending time with family.??????Pt does not have an AD/DPOA-HC and was educated on the importance of this document.??????  ???  Summary:SW met with pt and his wife to complete OLT Psychosocial Assessment update.  Pt receives disability and has [place] Medicaid for insurance. Pt denies current financial difficulties or concerns. Pt is aware of the risks and benefits of transplant. Pt's wife will provide 24 hr care and transportation assistance post transplant. Pt denies recent tobacco, drug or alcohol use. SW recommends ongoing random screens and counseling as needed.  Pt's case will be discussed with the Multidisciplinary Team.    Nicanor Bake, LMSW

## 2018-08-19 ENCOUNTER — Encounter: Admit: 2018-08-19 | Discharge: 2018-08-19

## 2018-08-22 MED ORDER — RIFAXIMIN 550 MG PO TAB
550 mg | ORAL_TABLET | Freq: Two times a day (BID) | ORAL | 0 refills | 30.00000 days | Status: DC
Start: 2018-08-22 — End: 2018-09-15

## 2018-08-31 ENCOUNTER — Encounter: Admit: 2018-08-31 | Discharge: 2018-08-31

## 2018-08-31 DIAGNOSIS — Z1159 Encounter for screening for other viral diseases: Secondary | ICD-10-CM

## 2018-08-31 NOTE — Patient Education
Called and spoke with pt.Reviewed instructions with him for his EGD and he verbalized understanding.Instructions sent to pt through his My Chart as requested. Covid nasal swab ordered and scheduled.  EGD (ESOPHAGOGASTRODUODENOSCOPY) PREP      Upper GI endoscopy allows healthcare providers to look directly into the beginning of your gastrointestinal(GI) tract.  The esophagus, stomach, and duodenum (first part of the small intestine) make up the upper GI tract.      5 Days Prior:  1. Check with your prescribing physician for instructions about stopping your blood thinner.  Examples of blood thinners are Aleve, Aspirin. Coumadin, Eliquis, Ibuprofen, Naproxen, Plavix, and Xarelto.  2. Do not give yourself a Lovenox injection the morning of the test. Lovenox injections may be taken as usual through the day before your test.    Day of Exam:  1. Do not eat or drink anything after midnight the night before your exam. However, if your exam is in the afternoon you may drink clear liquids only up until (4) hours before your scheduled procedure time.  After this, you should have nothing by mouth.  This includes GUM or CANDY.   a. Chewing tobacco must be stopped  (6) hours before your scheduled procedure.   b. If you have an early morning test, take ONLY your essential morning medications (heart, blood pressure, seizure, etc.) with a small sip of water.   c. You will be sedated for the procedure. A responsible adult must drive you home (no Benedetto Goad, taxis, or buses are permitted). If you do not have a driver we will be unable to do the test.   d. You will be here for (3-4) hours from arrival time.   e. You will not be able to return to work the same day.   f. Please bring a list of your current medications and the dosages with you.     The Procedure:  ? You will lie on the endoscopy table. Usually patients lie on the left side.  ? You will be monitored and given oxygen. ? You are given sedation (relaxing) medication through an intravenous (IV) line.  ? The healthcare provider will put the endoscope in your mouth and down your esophagus. It is thinner than most pieces of food that you swallow.  It will not affect your breathing. The medicine helps keep you from gagging.   ? Air is inserted to expand your GI tract. It can make you burp.  ? During the procedure, the healthcare provider can take biopsies (tissue samples), remove abnormalities such as polyps, or treat abnormalities though a variety of devices placed through the endoscope. You will not feel this.   ? The endoscope carries images of your upper GI tract to a video screen.  ? An adult must drive you home.

## 2018-09-13 ENCOUNTER — Ambulatory Visit: Admit: 2018-09-13 | Discharge: 2018-09-14

## 2018-09-13 DIAGNOSIS — Z1159 Encounter for screening for other viral diseases: Secondary | ICD-10-CM

## 2018-09-13 NOTE — Progress Notes
Patient arrived to [facility] for COVID-19 testing 09/13/18 1404. Patient identity confirmed via photo I.D. Nasopharyngeal procedure explained to the patient.   Nasopharyngeal swab completed left  Patient education provided given and instructed patient self isolate until contacted w/ results and further instructions. CDC handout on COVID-19 given to patient.   [email or web address]    Swab collected by Lorelee Cover RN.    Date symptoms began/reason for testing: preop

## 2018-09-14 LAB — COVID-19 (SARS-COV-2) PCR

## 2018-09-14 NOTE — Progress Notes
Left message offering patient 1045 AM appointment with an arrival time of 9:15 AM to check in at Admissions, requested call back if patient would like to move his appointment earlier.

## 2018-09-14 NOTE — Progress Notes
Patient had viewed results in MyChart. Additional MyChart message sent to patient. James Senn, RN

## 2018-09-15 ENCOUNTER — Ambulatory Visit: Admit: 2018-09-15 | Discharge: 2018-09-15

## 2018-09-15 ENCOUNTER — Encounter: Admit: 2018-09-15 | Discharge: 2018-09-15

## 2018-09-15 DIAGNOSIS — F1020 Alcohol dependence, uncomplicated: Secondary | ICD-10-CM

## 2018-09-15 DIAGNOSIS — K7460 Unspecified cirrhosis of liver: Secondary | ICD-10-CM

## 2018-09-15 MED ORDER — PROPOFOL 10 MG/ML IV EMUL 20 ML (INFUSION)(AM)(OR)
INTRAVENOUS | 0 refills | Status: DC
Start: 2018-09-15 — End: 2018-09-15
  Administered 2018-09-15: 19:00:00 150 ug/kg/min via INTRAVENOUS

## 2018-09-15 MED ORDER — RIFAXIMIN 550 MG PO TAB
550 mg | ORAL_TABLET | Freq: Two times a day (BID) | ORAL | 11 refills | 30.00000 days | Status: AC
Start: 2018-09-15 — End: ?

## 2018-09-15 MED ORDER — LIDOCAINE (PF) 200 MG/10 ML (2 %) IJ SYRG
0 refills | Status: DC
Start: 2018-09-15 — End: 2018-09-15
  Administered 2018-09-15: 19:00:00 100 mg via INTRAVENOUS

## 2018-09-15 MED ORDER — SODIUM CHLORIDE 0.9 % IV SOLP
1000 mL | INTRAVENOUS | 0 refills | Status: DC
Start: 2018-09-15 — End: 2018-09-15
  Administered 2018-09-15: 18:00:00 1000 mL via INTRAVENOUS

## 2018-09-15 MED ORDER — PROPOFOL INJ 10 MG/ML IV VIAL
0 refills | Status: DC
Start: 2018-09-15 — End: 2018-09-15
  Administered 2018-09-15: 19:00:00 40 mg via INTRAVENOUS
  Administered 2018-09-15: 19:00:00 50 mg via INTRAVENOUS
  Administered 2018-09-15: 19:00:00 20 mg via INTRAVENOUS

## 2018-09-15 NOTE — H&P (View-Only)
Pre Procedure History and Physical/Sedation Plan    Name:Tristan Briggs                                                                   MRN: [id number]                 DOB:[birth year]          Age: 38 y.o.  Date of Service: 09/15/2018    Date of Procedure:  09/15/2018    Planned Procedure(s):  GI:  EGD with PEG  Sedation/Medication Plan: MAC (Monitored Anesthesia Care)  Discussion/Reviews:  Physician has discussed risks and alternatives of this type of sedation and above planned procedures with patient  ___________________________________________________________________  Chief Complaint: Surveillance of varices    History of Present Illness: Tristan Briggs is a 38 y.o. male with history as below    Previous Anesthetic/Sedation History:  reviewed    Medical History:   Diagnosis Date   ??? Alcoholism (HCC)    ??? Cirrhosis (HCC)      Surgical History:   Procedure Laterality Date   ??? ESOPHAGOGASTRODUODENOSCOPY WITH SPECIMEN COLLECTION BY BRUSHING/ WASHING N/A 04/26/2017    Performed by Tempie Hoist, DO at [facility] ENDO   ??? PARACENTESIS       Pertinent medical/surgical history reviewed  Pertinent family history reviewed  Social History     Tobacco Use   ??? Smoking status: Former Smoker     Last attempt to quit: 2017     Years since quitting: 3.6   ??? Smokeless tobacco: Never Used   Substance Use Topics   ??? Alcohol use: Not Currently     Comment: last use 04/11/2016   ??? Drug use: Not Currently     Comment: marijuana in the past     Social History     Substance and Sexual Activity   Drug Use Not Currently    Comment: marijuana in the past     Allergies:  Patient has no known allergies.  Medications  Current Facility-Administered Medications   Medication   ??? sodium chloride 0.9 %   infusion     Review of Systems:  All other systems reviewed and are negative.           Physical Exam:  Temp: 36.7 ???C (98.1 ???F) (09/10 1300)  Pulse: 64 (09/10 1300)  Respirations: 14 PER MINUTE (09/10 1300)  BP: 129/87 (09/10 1300) Physical Exam   General appearance: alert  Throat: Lips, mucosa, and tongue normal. Teeth and gums normal  Lungs: clear to auscultation bilaterally  Heart: regular rate and rhythm, S1, S2 normal, no murmur, click, rub or gallop  Abdomen: soft, non-tender. Bowel sounds normal. No masses,  no organomegaly  Extremities: extremities normal, atraumatic, no cyanosis or edema  Airway:  airway assessment performed  Mallampati II (soft palate, uvula, fauces visible)  Anesthesia Classification:  ASA III (A patient with a severe systemic disease that limits activity, but is not incapacitating)  NPO Status: Acceptable  Pregnancy Status: Not Pregnant    Lab/Radiology/Other Diagnostic Tests  Labs:  Relevant labs reviewed      Normajean Baxter, MD  Gastroenterology Staff

## 2018-09-15 NOTE — Discharge Planning (AHS/AVS)
EGD/Upper EUS/ERCP/Antegrade Enteroscopy  Post Upper Endoscopy Instructions      -  -You may have a sore throat after the procedure for 2-3 days.  Try sucrets or lozenges to help ease the pain.  If it continues please contact us.    -If you feel feverish, have a temperature of 101 degrees or higher, persistent nausea and vomiting, abdominal pain or dark stools; please notify your nurse or GI physician.    -You may have abdominal cramping following the procedure this can be relieved by belching or passing air.    -If you have redness or swelling at the IV site, place a warm, wet washcloth over the affected areas for 15 minutes, 3-4 times a day until the redness subsides.  If symptoms continue for 2-3 days, contact your regular physician.    - If you have bleeding from your mouth, over 2 tablespoons and increasing, please notify your physician.  A small amount of bleeding is normal if a biopsy or polyps were taken.  If you are vomiting blood you need to seek immediate medical attention.    - You may resume all your routine medications, if medications need to be held your physician and/or nurse will notify you post procedure.    SPECIFIC INSTRUCTIONS  INPATIENTS:  Ask for help when you get up in your room, as you may still be drowsy from your sedation.    OUTPATIENTS:  A. Because of sedation and lack of coordination, FOR THE NEXT 24 HOURS, DO NOT:  1. Operate any motorized vehicle - this includes driving.  2. Sign any legal documents or conduct important business matters.  3. Use any dangerous machinery (chain saw, lawnmower, etc.).  4. Drink any alcoholic beverages.  Should you have any questions or concerns after your procedure please call [phone] M-F 8am-5:00 pm. After 5:00 pm, holidays or weekends call [phone] and ask for the GI Doctor on call.

## 2018-09-15 NOTE — Anesthesia Pre-Procedure Evaluation
Anesthesia Pre-Procedure Evaluation    Name: Tristan Briggs      MRN: [id number]     DOB: 1980-11-29     Age: 38 y.o.     Sex: male   _________________________________________________________________________     Procedure Info:   Procedure Information     Date/Time:  09/15/18 1355    Procedure:  ESOPHAGOGASTRODUODENOSCOPY WITH SPECIMEN COLLECTION BY BRUSHING/ WASHING (N/A )    Location:  ENDO 6 (IR) / ENDO/GI    Surgeon:  Veneta Penton, MD          Physical Assessment  Vital Signs (last filed in past 24 hours):         Patient History   No Known Allergies     Current Medications    Medication Directions   furosemide (LASIX) 20 mg tablet Take two tablets by mouth every morning.   GLIMEPIRIDE PO Take  by mouth.   midodrine (PROAMATINE) 5 mg tablet Take one tablet by mouth three times daily.   ondansetron (ZOFRAN) 4 mg tablet TAKE 1 TABLET BY MOUTH EVERY 8 HOURS AS NEEDED FOR NAUSEA AND VOMITING   rifAXIMin (XIFAXAN) 550 mg tablet Take one tablet by mouth twice daily. Indications: impaired brain function due to liver disease   spironolactone (ALDACTONE) 25 mg tablet Take 1 tablet by mouth once daily   traZODone (DESYREL) 50 mg tablet TAKE 1 TO 2 TABLETS BY MOUTH ONCE DAILY AT BEDTIME AS NEEDED FOR SLEEP   zinc sulfate 220 mg (50 mg elemental zinc) capsule Take one capsule by mouth daily.         Review of Systems/Medical History        PONV Screening: Non-smoker  No history of anesthetic complications  No family history of anesthetic complications      Airway - negative        Pulmonary - negative          Cardiovascular       Recent diagnostic studies:          echocardiogram             ECHO 07/2018     1. Normal LV size and systolic function. LVEF 55-65%  2. Mildly dilated RV with preserved systolic function.   3. No hemodynamically significant valve disease.   4. Estimated peak PA systolic pressure of 16 mmHg          Exercise tolerance: >4 METS      Beta Blocker therapy: No Beta blockers within 24 hours: n/a      On midodrine      GI/Hepatic/Renal         Liver disease (Decompensated alcohol-related liver disease, listed for OLT)      Cirrhosis       Renal disease (hepatorenal sydrome): CRI       H/o esophageal varices     no longer requiring weekly paracentesis since diuretics restarted, last 01/2018      Neuro/Psych         Substance abuse (ETOH abuse, quit 04/2016)      Musculoskeletal - negative        Endocrine/Other       Diabetes (pre-diabetic, on metformin)    Constitution - negative   Physical Exam    Airway Findings      Mallampati: II      TM distance: >3 FB      Neck ROM: full      Mouth opening: good  Airway patency: adequate      Comments: large beard     Dental Findings:       Upper dentures    Cardiovascular Findings: Negative      Rhythm: regular      Rate: normal    Pulmonary Findings: Negative      Breath sounds clear to auscultation.    Abdominal Findings: Negative        Abdomen soft    Neurological Findings: Negative      Alert and oriented x 3    Constitutional findings: Negative       Diagnostic Tests  Hematology:   Lab Results   Component Value Date    HGB 15.4 08/02/2018    HCT 42.7 08/02/2018    PLTCT 211 08/02/2018    WBC 5.9 08/02/2018    NEUT 59 08/02/2018    ANC 3.49 08/02/2018    LYMPH 27.2 05/31/2018    ALC 1.63 08/02/2018    MONA 8 08/02/2018    AMC 0.46 08/02/2018    EOSA 4 08/02/2018    ABC 0.07 08/02/2018    BASOPHILS 2.0 05/31/2018    MCV 91.4 08/02/2018    MCH 32.9 08/02/2018    MCHC 36.0 08/02/2018    MPV 8.3 08/02/2018    RDW 12.6 08/02/2018         General Chemistry:   Lab Results   Component Value Date    NA 141 08/02/2018    K 4.1 08/02/2018    CL 105 08/02/2018    CO2 25 08/02/2018    GAP 11 08/02/2018    BUN 29 08/02/2018    CR 1.53 08/02/2018    GLU 147 08/02/2018    GLU 237 05/31/2018    CA 10.3 08/02/2018    ALBUMIN 4.9 08/02/2018    LACTIC 1.5 01/01/2017    MG 1.7 03/15/2017    TOTBILI 1.5 08/02/2018    PO4 3.9 03/15/2017 Coagulation:   Lab Results   Component Value Date    PT 12.6 05/31/2018    PTT 34.0 03/15/2017    INR 1.1 08/02/2018         Anesthesia Plan    ASA score: 3   Plan: MAC  Induction method: intravenous  NPO status: acceptable      Informed Consent  Anesthetic plan and risks discussed with patient.  Use of blood products discussed with patient  Blood Consent: consented      Plan discussed with: anesthesiologist and CRNA.

## 2018-09-16 ENCOUNTER — Encounter: Admit: 2018-09-16 | Discharge: 2018-09-16

## 2018-09-16 DIAGNOSIS — F1020 Alcohol dependence, uncomplicated: Secondary | ICD-10-CM

## 2018-09-16 DIAGNOSIS — K7460 Unspecified cirrhosis of liver: Secondary | ICD-10-CM

## 2018-09-16 NOTE — Anesthesia Post-Procedure Evaluation
Post-Anesthesia Evaluation    Name: Tristan Briggs      MRN: [id number]     DOB: 1980-03-01     Age: 38 y.o.     Sex: male   __________________________________________________________________________     Procedure Information     Anesthesia Start Date/Time:  09/15/18 1412    Procedure:  ESOPHAGOGASTRODUODENOSCOPY WITH SPECIMEN COLLECTION BY BRUSHING/ WASHING (N/A )    Location:  ENDO 6 (IR) / ENDO/GI    Surgeon:  Lanney Gins, MD          Post-Anesthesia Vitals      Vitals Value Taken Time   BP 121/88 09/15/2018  2:30 PM   Temp 36.6 C (97.9 F) 09/15/2018  2:28 PM   Pulse 83 09/15/2018  2:30 PM   Respirations 19 PER MINUTE 09/15/2018  2:30 PM   SpO2 96 % 09/15/2018  2:30 PM         Post Anesthesia Evaluation Note    Evaluation location: Pre/Post  Patient participation: recovered; patient participated in evaluation  Level of consciousness: alert  Pain management: adequate    Hydration: normovolemia  Temperature: 36.0C - 38.4C  Airway patency: adequate    Perioperative Events       Post-op nausea and vomiting: no PONV    Postoperative Status  Cardiovascular status: hemodynamically stable  Respiratory status: spontaneous ventilation  Follow-up needed: none        Perioperative Events

## 2018-10-11 ENCOUNTER — Encounter: Admit: 2018-10-11 | Discharge: 2018-10-11

## 2018-11-03 ENCOUNTER — Encounter: Admit: 2018-11-03 | Discharge: 2018-11-03

## 2018-11-03 DIAGNOSIS — K7031 Alcoholic cirrhosis of liver with ascites: Secondary | ICD-10-CM

## 2018-11-03 NOTE — Telephone Encounter
Spoke with pt.  He v/u he is due for labs to stay up to date on the waitlist by 11/6.  Lab orders faxed to [facility] lab to [phone]. Fax confirmation received.  Pt denies any need for paracentesis since January. When he thought he needed one recently no fluid found.  No refills needed.  He completed disability paperwork but states he might have it returned and need our assistance. Advised to email or fax to me if so.  All questions/concerns addressed.

## 2018-11-11 ENCOUNTER — Encounter: Admit: 2018-11-11 | Discharge: 2018-11-11

## 2018-11-11 NOTE — Progress Notes
Updated candidate's record on the transplant waitlist.  Reviewed with the attending provider.   Due to COVID-19 emergency actions, candidate's previous reported clinical data was reported with today's date.

## 2018-11-18 ENCOUNTER — Encounter: Admit: 2018-11-18 | Discharge: 2018-11-18

## 2018-11-22 ENCOUNTER — Encounter: Admit: 2018-11-22 | Discharge: 2018-11-22

## 2018-11-22 DIAGNOSIS — K7031 Alcoholic cirrhosis of liver with ascites: Secondary | ICD-10-CM

## 2018-11-22 LAB — COMPREHENSIVE METABOLIC PANEL
Lab: 76 (ref 40–150)
Lab: 0.8
Lab: 1.5 % — ABNORMAL HIGH (ref 0.72–1.25)
Lab: 109 K/UL — ABNORMAL HIGH (ref 98–107)
Lab: 14 meq/L — ABNORMAL HIGH (ref 0–14)
Lab: 142 mmol/L (ref 136–145)
Lab: 170 % — ABNORMAL HIGH (ref >60)
Lab: 23 mmol/L — ABNORMAL HIGH (ref 22–29)
Lab: 29 % — ABNORMAL HIGH (ref 8.9–20.6)
Lab: 3.9 mmol/L — ABNORMAL LOW (ref 3.5–5.1)
Lab: 34 (ref 5–34)
Lab: 4 K/UL — ABNORMAL LOW (ref 1.8–7.0)
Lab: 44 (ref 0–55)
Lab: 55 % — AB (ref >59)
Lab: 7.2
Lab: 8.9 % (ref 8.4–10.2)

## 2018-11-22 LAB — CBC AND DIFF
Lab: 0.1
Lab: 4.5 uL — ABNORMAL LOW (ref 4.70–6.10)
Lab: 6.2 x10-3/uL (ref 4.8–10.8)

## 2018-11-22 LAB — PROTIME INR (PT)
Lab: 1.1 % — ABNORMAL LOW (ref 36–45)
Lab: 12 s — ABNORMAL HIGH (ref 9.9–12.6)

## 2018-11-28 ENCOUNTER — Encounter: Admit: 2018-11-28 | Discharge: 2018-11-28

## 2018-11-28 DIAGNOSIS — K7031 Alcoholic cirrhosis of liver with ascites: Secondary | ICD-10-CM

## 2018-11-28 LAB — ALPHA FETO PROTEIN (AFP): Lab: 3.3 ng/mL (ref <6.1)

## 2018-11-28 LAB — 25-OH VITAMIN D (D2 + D3): Lab: 26 ng/mL — ABNORMAL LOW (ref 30–100)

## 2018-11-28 LAB — VITAMIN A: Lab: 48

## 2018-12-08 ENCOUNTER — Encounter: Admit: 2018-12-08 | Discharge: 2018-12-08

## 2018-12-14 ENCOUNTER — Encounter: Admit: 2018-12-14 | Discharge: 2018-12-14

## 2018-12-14 NOTE — Progress Notes
Submitted PA for Xifaxan via [email or web address] through [facility] with Key [id number]. Will await determination.

## 2018-12-14 NOTE — Progress Notes
Received favorable outcome from [facility].

## 2019-02-02 ENCOUNTER — Ambulatory Visit: Admit: 2019-02-02 | Discharge: 2019-02-02 | Payer: MEDICAID

## 2019-02-02 ENCOUNTER — Encounter: Admit: 2019-02-02 | Discharge: 2019-02-02

## 2019-02-02 DIAGNOSIS — K7031 Alcoholic cirrhosis of liver with ascites: Secondary | ICD-10-CM

## 2019-02-02 DIAGNOSIS — F1020 Alcohol dependence, uncomplicated: Secondary | ICD-10-CM

## 2019-02-02 DIAGNOSIS — K7460 Unspecified cirrhosis of liver: Secondary | ICD-10-CM

## 2019-02-02 LAB — COMPREHENSIVE METABOLIC PANEL
Lab: 1.3 mg/dL — ABNORMAL HIGH (ref 0.4–1.24)
Lab: 109 mg/dL — ABNORMAL HIGH (ref 70–100)
Lab: 141 MMOL/L (ref 137–147)
Lab: 4 MMOL/L (ref 3.5–5.1)

## 2019-02-02 LAB — 25-OH VITAMIN D (D2 + D3): Lab: 26 ng/mL — ABNORMAL LOW (ref 30–80)

## 2019-02-02 LAB — CBC AND DIFF
Lab: 0 10*3/uL (ref 0–0.20)
Lab: 0.2 10*3/uL (ref 0–0.45)
Lab: 0.3 10*3/uL (ref 0–0.80)
Lab: 1 % (ref 0–2)
Lab: 1.6 10*3/uL (ref 1.0–4.8)
Lab: 12 % (ref 11–15)
Lab: 15 g/dL (ref 13.5–16.5)
Lab: 194 K/UL (ref 150–400)
Lab: 27 % (ref 24–44)
Lab: 3.5 10*3/uL (ref 1.8–7.0)
Lab: 31 pg (ref 26–34)
Lab: 34 g/dL (ref 32.0–36.0)
Lab: 4.9 M/UL (ref 4.4–5.5)
Lab: 45 % (ref 40–50)
Lab: 5 % (ref 0–5)
Lab: 5.9 K/UL (ref 4.5–11.0)
Lab: 60 % (ref >60)
Lab: 7 % (ref 4–12)
Lab: 9.1 FL — ABNORMAL LOW (ref >60)

## 2019-02-02 LAB — PROTIME INR (PT): Lab: 1.1 MMOL/L (ref 0.8–1.2)

## 2019-02-02 LAB — ALPHA FETO PROTEIN (AFP): Lab: 3.7 ng/mL — ABNORMAL HIGH (ref 0.0–15.0)

## 2019-02-02 NOTE — Progress Notes
CHIEF COMPLAINT/PURPOSE FOR VISIT: Routine follow up patient with decompensated alcohol related cirrhosis.    HISTORY OF PRESENT ILLNESS: [name] is a very pleasant 39 year old gentleman who we follow for  decompensated alcohol-related liver disease.  At the time of his initial presentation to [place], [name] had severe renal dysfunction, marked malnutrition and his overall health was quite poor.  With ongoing alcohol abstinence he has enjoyed marked improvement in his overall state of health.      Today he reports that he has returned to work, his energy level is improved and he generally feels quite well.  He has continued to down-titrate his diuretic usage and is currently only taking furosemide 20 mg PRN.  Previously he required intermittent paracentesis but has not required any for quite some time.      He remains active on our transplant list.      REVIEW OF SYSTEMS: A 10-point review of systems is performed and it is notable only for persistent fatigue. He is requiring intermittent paracentesis.      PHYSICAL EXAMINATION:   GENERAL: The patient is chronically ill in appearance, but in no acute distress.   HEENT: Pupils are equal, round, and reactive.  EOMI.  There is mild scleral icterus.   RESPIRATORY: Equal air entry noted in all lung fields.  No adventitious sounds.   CARDIOVASCULAR: Heart, regular rate and rhythm.   ABDOMEN: Soft, non distended nontender.  EXTREMITIES: Full range of motion.  No clubbing or cyanosis.  There is no lower extremity edema.    NEUROLOGIC: Normal exam.  No asterixis.  No tremor.     IMPRESSION/REPORT/PLAN: #1.  Decompensated alcohol-related liver disease: [name] has a MELD-Na is most recently calculated at 91.  He is actively listed for OLT.  He was not approved for living donor at [facility].  We spent extended time today discussing the fact that his liver disease has improved to point that transplant is no currently indicated.  Given this we discussed wait list removal.  I discussed the rational for this.  We will re-visit this issue at our next appointment and if he remains stable will plan to remove.      #2.  Acute-on-chronic kidney injury RESOLVED: [name]'s renal function has dramatically improved.  At this time he will not need to be considered for [facility].    #3.  Ascites and edema. Continued improvement.  Continues 20 mg furosemide PRN.  Off of spironolactone.      #4.  Hepatic encephalopathy:  Currently well controlled on monotherapy with rifaximin.    #5.  Screening for HCC:  US performed today was without worrisome lesion.    #6.   Esophageal varices:  EGD in 09/2018 with only small varices.  Will repeat at 1 year then likely can extend this to every 2 years.      #7.  Followup: We would like to see [name] back in clinic in approximately  6 months or sooner if he has any worrisome symptom changes.

## 2019-02-06 ENCOUNTER — Encounter: Admit: 2019-02-06 | Discharge: 2019-02-06

## 2019-02-07 ENCOUNTER — Encounter: Admit: 2019-02-07 | Discharge: 2019-02-07

## 2019-02-07 DIAGNOSIS — Z01818 Encounter for other preprocedural examination: Secondary | ICD-10-CM

## 2019-02-18 ENCOUNTER — Encounter: Admit: 2019-02-18 | Discharge: 2019-02-18

## 2019-02-20 MED ORDER — FUROSEMIDE 20 MG PO TAB
20 mg | ORAL_TABLET | Freq: Every day | ORAL | 1 refills | 90.00000 days | Status: AC | PRN
Start: 2019-02-20 — End: ?

## 2019-03-06 ENCOUNTER — Encounter: Admit: 2019-03-06 | Discharge: 2019-03-06

## 2019-03-06 NOTE — Telephone Encounter
03/06/19 received TC from [facility] [place] stating that [facility] became primary as of 10/06/2018. Verified via One [employer] and updated [place] Guarantor to reflect MDCR primary and [employer] [place] MDCD secondary. Notified billing team and asked to resubmit claims. [employer]

## 2019-03-26 ENCOUNTER — Encounter: Admit: 2019-03-26 | Discharge: 2019-03-26

## 2019-03-27 ENCOUNTER — Encounter: Admit: 2019-03-27 | Discharge: 2019-03-27

## 2019-03-27 MED ORDER — MIDODRINE 5 MG PO TAB
ORAL_TABLET | Freq: Three times a day (TID) | 0 refills | Status: AC
Start: 2019-03-27 — End: ?

## 2019-03-27 NOTE — Telephone Encounter
Patients wife called in regarding getting a release from work for the patient.

## 2019-03-30 ENCOUNTER — Encounter: Admit: 2019-03-30 | Discharge: 2019-03-30

## 2019-04-07 ENCOUNTER — Encounter: Admit: 2019-04-07 | Discharge: 2019-04-07

## 2019-04-07 NOTE — Telephone Encounter
spoke to patient's wife. Patient returned to work in November 2020, and does not want to continue disability. Wife confirms patient still has active [employer] with disability, and can elect commercial insurance through "qualifying event" on the event he discontinues disability.   Patient actively listed. Message sent to TFA, Margaretha Glassing requesting to call patient to further discuss financial options.     Wife notified TFA will be in touch.

## 2019-04-12 IMAGING — US USGDPARCEN
1 series · 4 of 4 positions shown · non-contrast
Comparison: none

ULTRASOUND REPORT

EXAM
Ultrasound-guided paracentesis.
INDICATION: Cirrhosis, ascites.
TECHNIQUE: Standard ultrasound-guided paracentesis performed routine fashion.

[Series 1: us guided paracentesis · 4 of 4 slices shown]
[im 1/4]
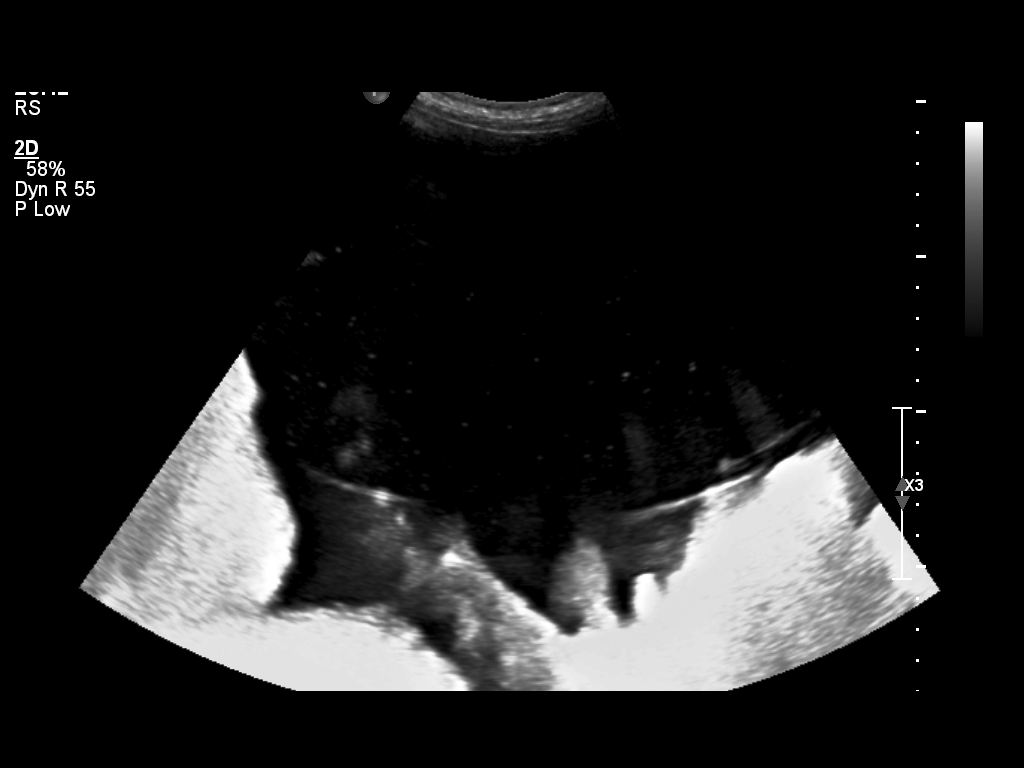
[im 2/4]
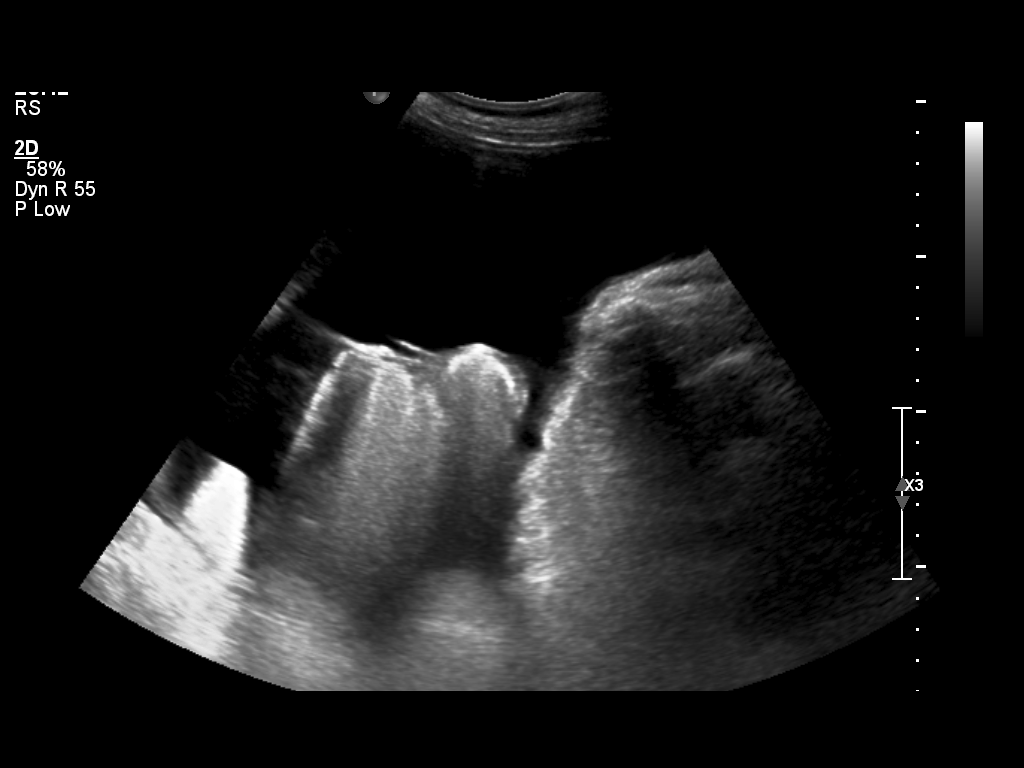
[im 3/4]
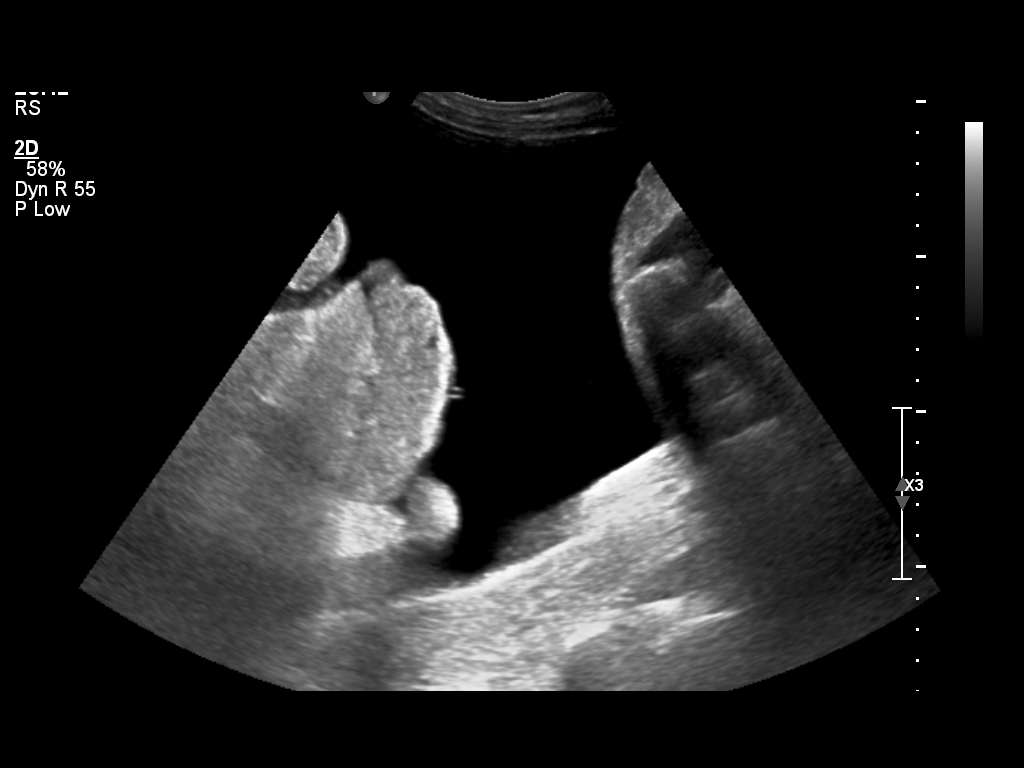
[im 4/4]
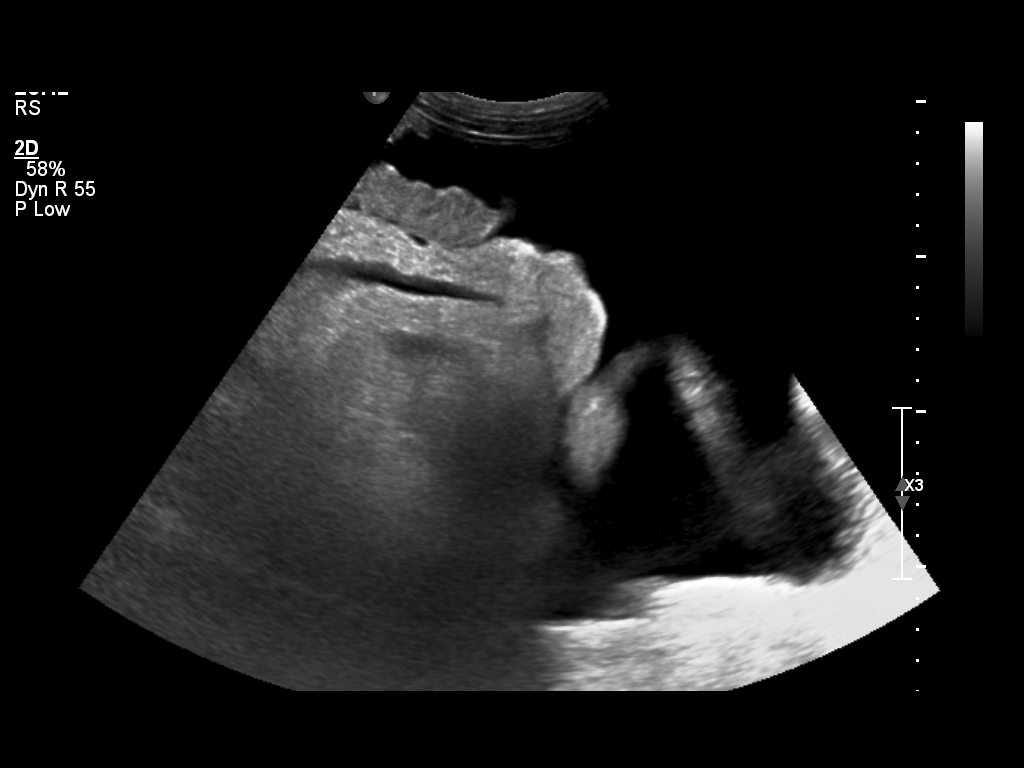

[4 of 4 positions shown; findings below may reference images not displayed]

FINDINGS: Procedure risks were explained in detail to the patient. After informed consent, patient was
prepped and draped in sterile fashion and 13.5 liters of ascites removed. Patient was sent for
albumin infusion. Fluid analysis is pending.
IMPRESSION: Successful ultrasound-guided paracentesis.

## 2019-04-24 NOTE — Telephone Encounter
04/24/19 Verified active Insurance via [employer] and [place] MDCD web site.   MDCR A&B - [employer] Reference Number:  [id number]  [place] MDCD - Verification No. [id number] - 04/24/2019  NO AUTHORIZATIONS REQUIRED

## 2019-05-11 ENCOUNTER — Encounter: Admit: 2019-05-11 | Discharge: 2019-05-11

## 2019-05-11 LAB — COMPREHENSIVE METABOLIC PANEL
Lab: 106
Lab: 10 mg/dL — ABNORMAL HIGH (ref 8.4–10.2)
Lab: 141 mmol/L (ref 136–145)
Lab: 165 mg/dL — ABNORMAL HIGH (ref 70–105)
Lab: 4.4 mmol/L (ref 3.5–5.1)

## 2019-05-11 LAB — PROTIME INR (PT): Lab: 12 s (ref 9.9–12.6)

## 2019-05-15 ENCOUNTER — Encounter: Admit: 2019-05-15 | Discharge: 2019-05-15

## 2019-05-15 DIAGNOSIS — K769 Liver disease, unspecified: Secondary | ICD-10-CM

## 2019-05-15 LAB — CBC AND DIFF
Lab: 0.1 10*3/uL (ref 0.0–0.2)
Lab: 0.3 10*3/uL (ref 0.0–0.6)
Lab: 0.5 10*3/uL (ref 0.1–0.9)
Lab: 1.2 10*3/uL (ref 0.9–5.1)
Lab: 1.9 % (ref 0.0–2.0)
Lab: 12 % (ref 11.5–14.5)
Lab: 166 10*3/uL (ref 130–400)
Lab: 17 g/dL (ref 14.0–18.0)
Lab: 19 % (ref 18.0–47.0)
Lab: 31 pg — ABNORMAL HIGH (ref 27.0–31.0)
Lab: 32 g/dL (ref 30.0–34.0)
Lab: 4.3 10*3/uL (ref 1.9–8.1)
Lab: 4.8 % (ref 0.0–6.0)
Lab: 5.4 10*6/uL (ref 4.70–6.10)
Lab: 53 % — ABNORMAL HIGH (ref 42.0–52.0)
Lab: 6.4 10*3/uL (ref 4.8–10.8)
Lab: 67 % (ref 40.0–75.0)
Lab: 7.1 % (ref 0.0–10.0)
Lab: 97 fL (ref 80.0–99.0)

## 2019-05-17 IMAGING — US PARAWI
1 series · 4 of 4 positions shown · non-contrast
Comparison: none

[Series 1: us paracentesis abd w/image · 4 of 4 slices shown]
[im 1/4]
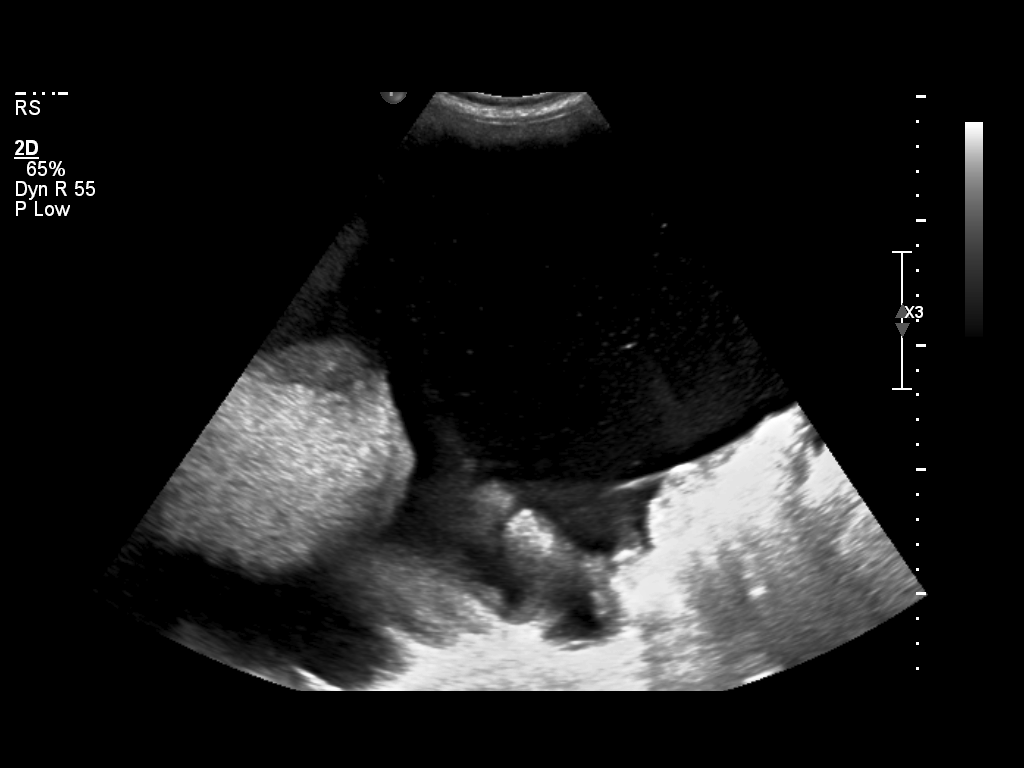
[im 2/4]
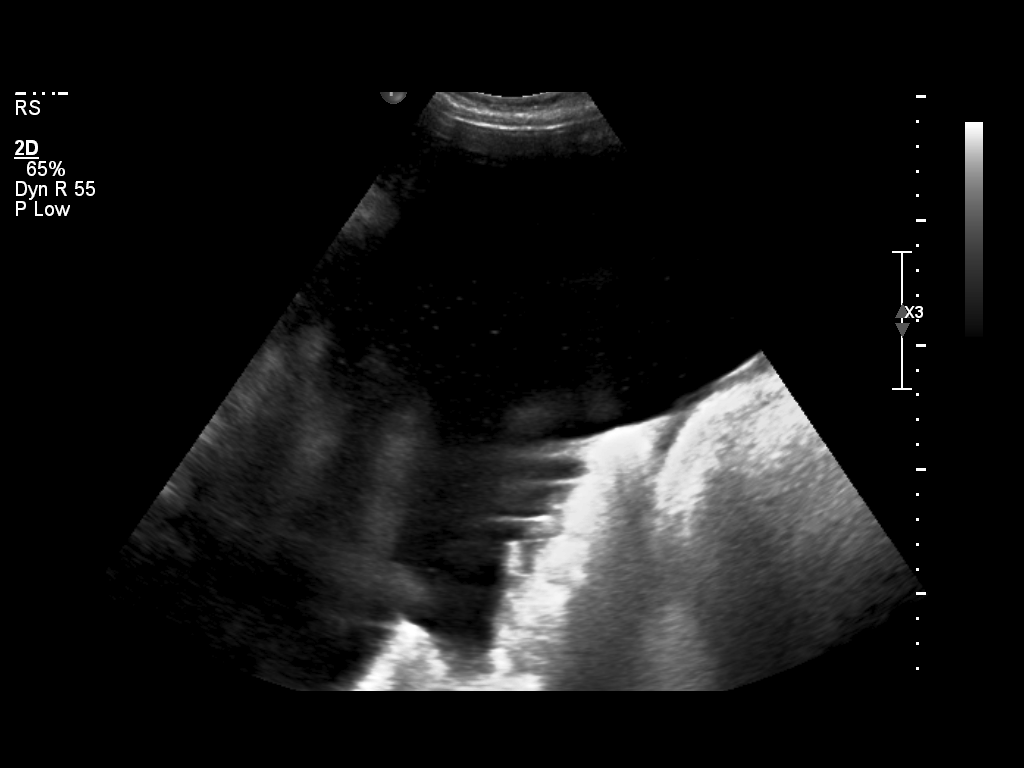
[im 3/4]
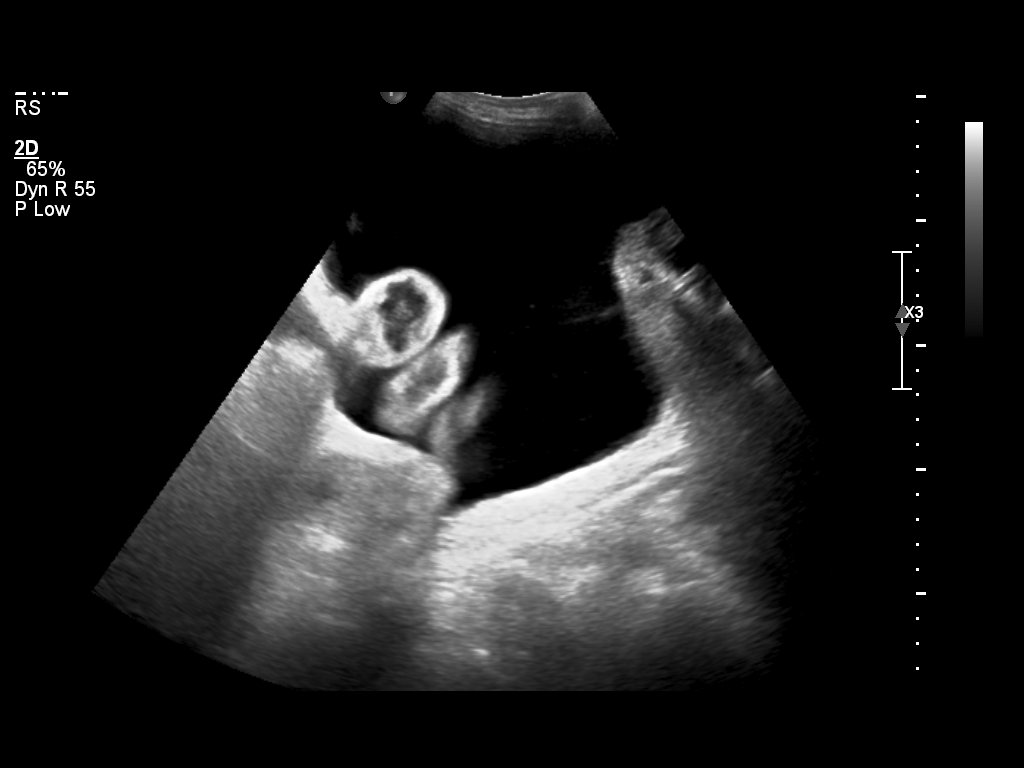
[im 4/4]
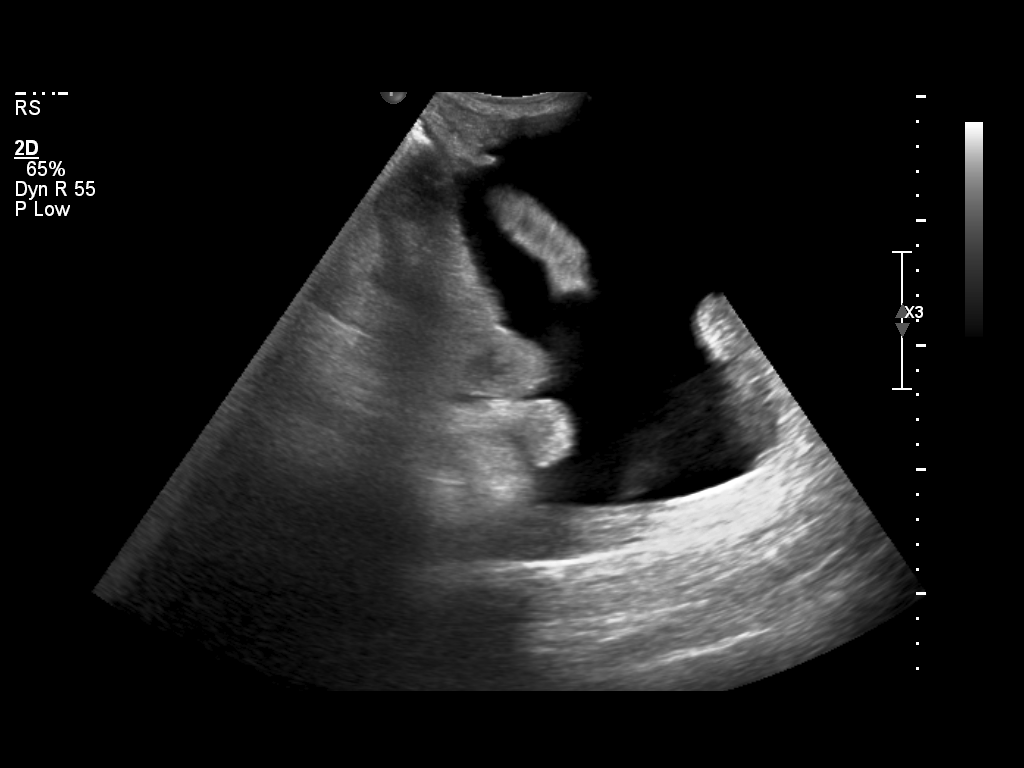

[4 of 4 positions shown; findings below may reference images not displayed]

DIAGNOSTIC STUDIES

EXAM

ULTRASOUND-GUIDED PARACENTESIS

INDICATION

Alcoholic Cirrhosis of Liver with Ascites; sonographically guided removal 12 liters ascites

Post paracentesis albumin administration per referring clinician to follow procedure JL

PROCEDURE []

The procedure, complications, alternatives were explained to the patient who understood, consented,
and wish to proceed. Time-out was performed. Using local anesthesia, aseptic technique, and s
onographic guidance, a Safe-T-Centesis was placed into a pocket of fluid in the right lateral
abdomen. The catheter was advanced and the needle was removed. 12 liters of serous ascites was
removed. The catheter was removed. Continuous monitoring was provided during examination. The
patient was transferred for post paracentesis LV min therapy per the referring clinician.

IMPRESSION

Successful sonographically guided paracentesis with removal of 12 liters.

Tech Notes:

alcoholic cirrhosis ascites. 12 liters removed, albumin administration to follow. JL

## 2019-05-24 IMAGING — US PARAWI
1 series · 4 of 4 positions shown · non-contrast
Comparison: none

EXAM

Ultrasound-guided paracentesis.
CLINICAL HISTORY: Ascites.

[Series 1: us paracentesis abd w/image · 4 of 4 slices shown]
[im 1/4]
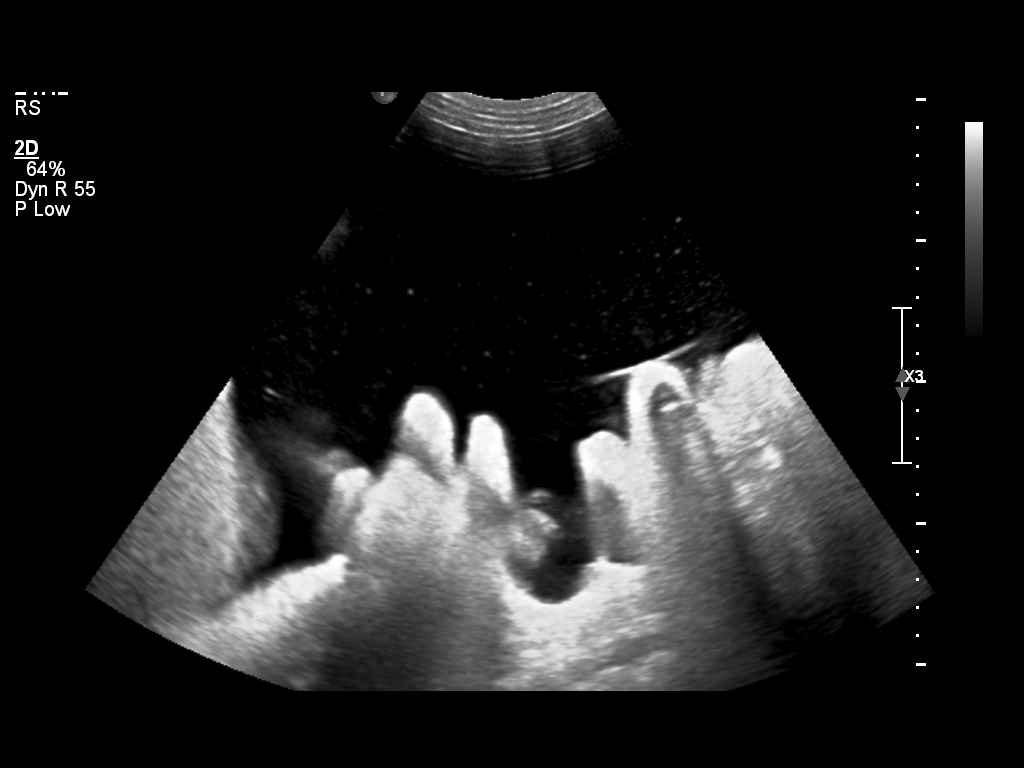
[im 2/4]
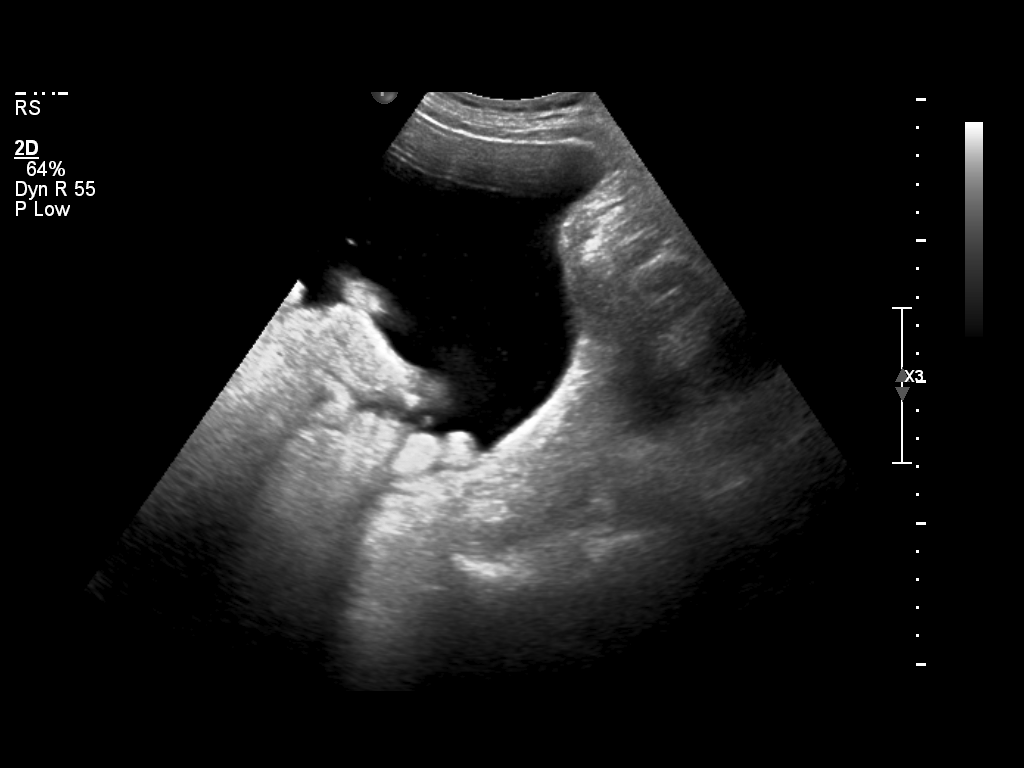
[im 3/4]
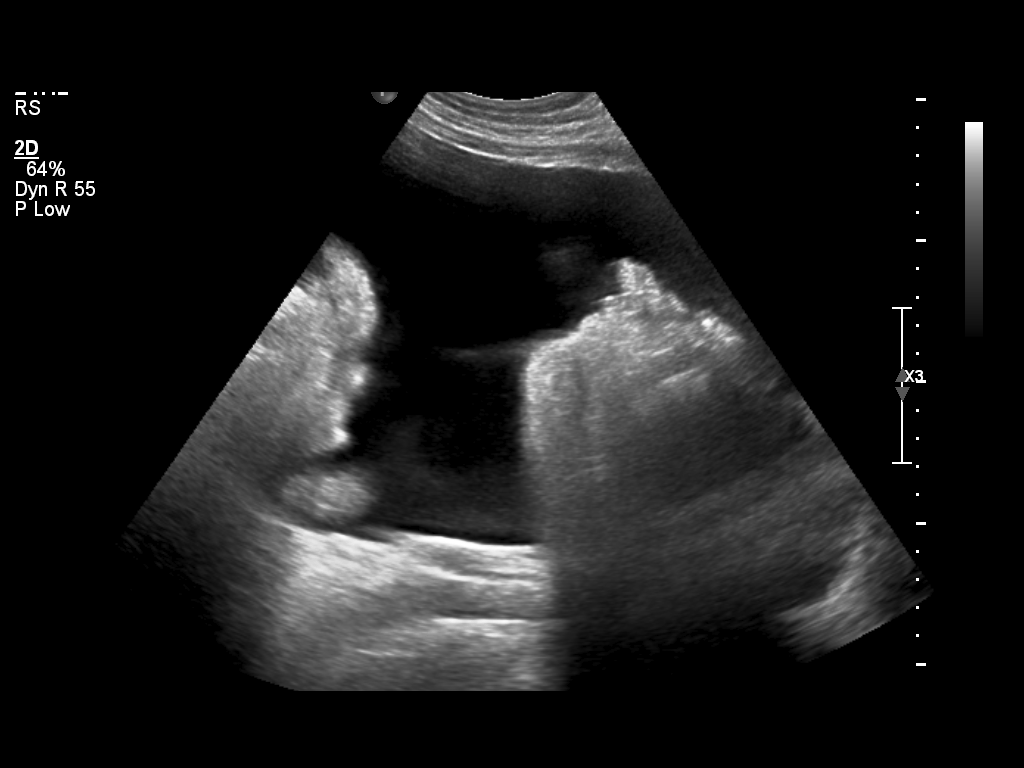
[im 4/4]
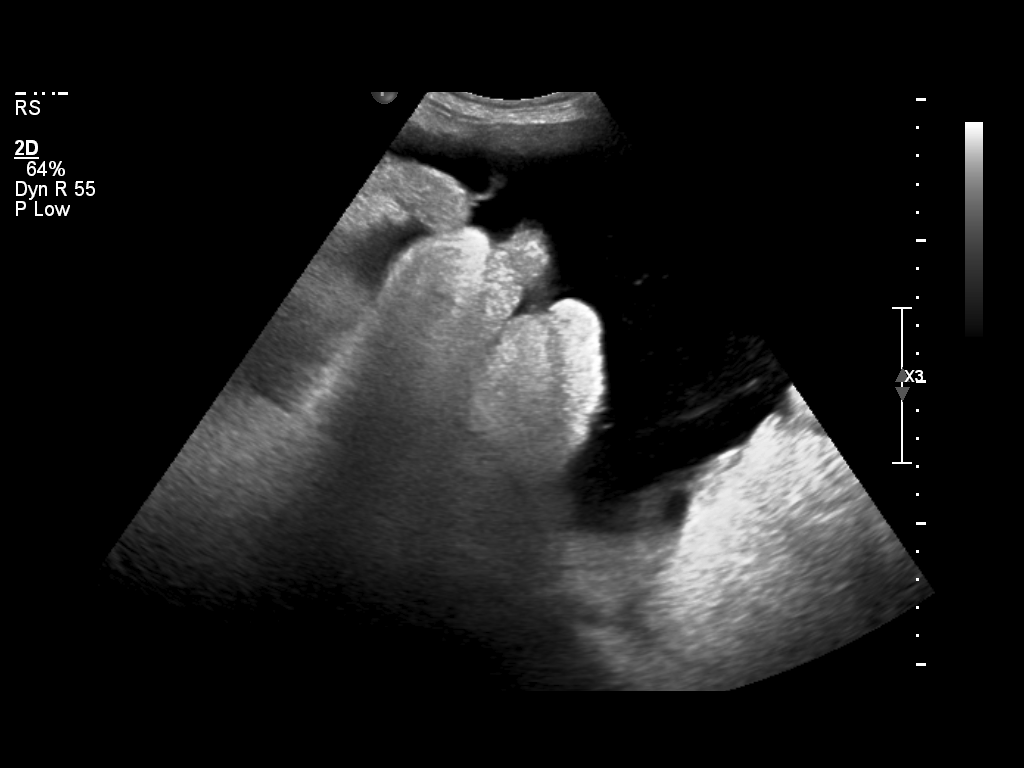

[4 of 4 positions shown; findings below may reference images not displayed]

TECHNIQUE

Standard ultrasound-guided paracentesis was performed routine fashion.

FINDINGS

Procedure and risks were explained in detail to the patient. After informed consent, patient was
prepped and draped in sterile fashion. Large pocket of ascites was identified the right lower
quadrant using ultrasound guidance. Subsequent paracentesis was performed with 10.5 liters removed.
Albumin infusion is pending. Patient tolerated procedure well.
IMPRESSION: Successful ultrasound-guided paracentesis.

Tech Notes:

cirrhosis

## 2019-05-31 IMAGING — US PARAWI
1 series · 4 of 4 positions shown · non-contrast
Comparison: none

EXAM

Ultrasound-guided paracentesis.
CLINICAL HISTORY: Ascites.
TECHNIQUE: Standard ultrasound-guided paracentesis was performed in routine fashion.

[Series 1: us paracentesis abd w/image · 4 of 4 slices shown]
[im 1/4]
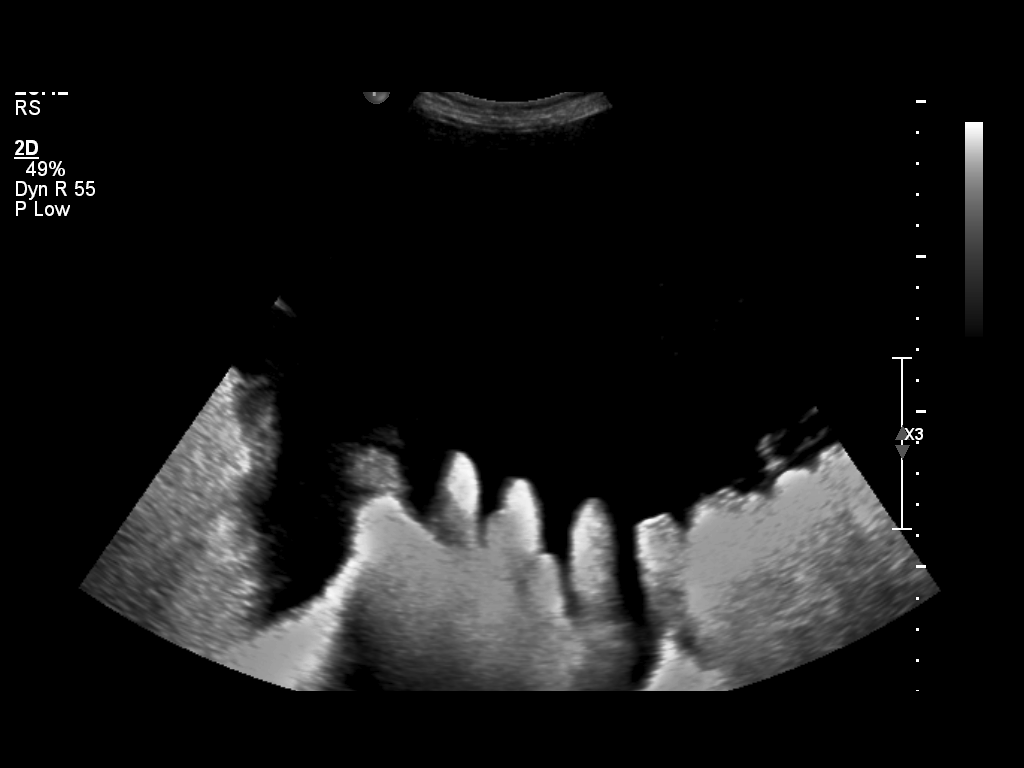
[im 2/4]
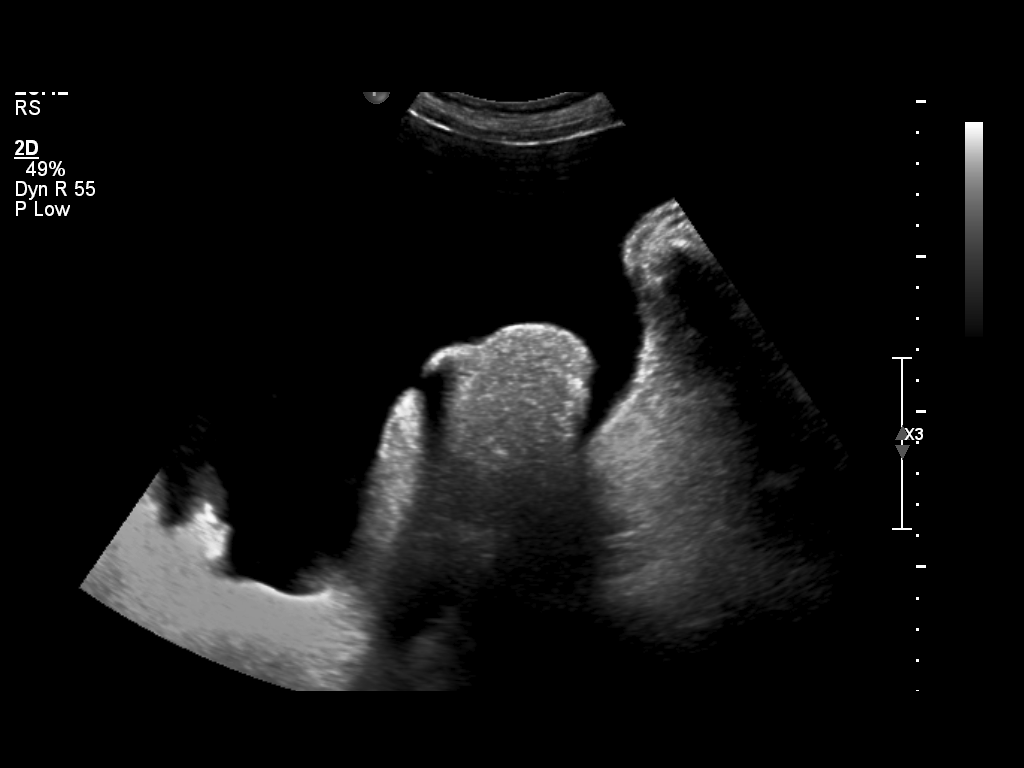
[im 3/4]
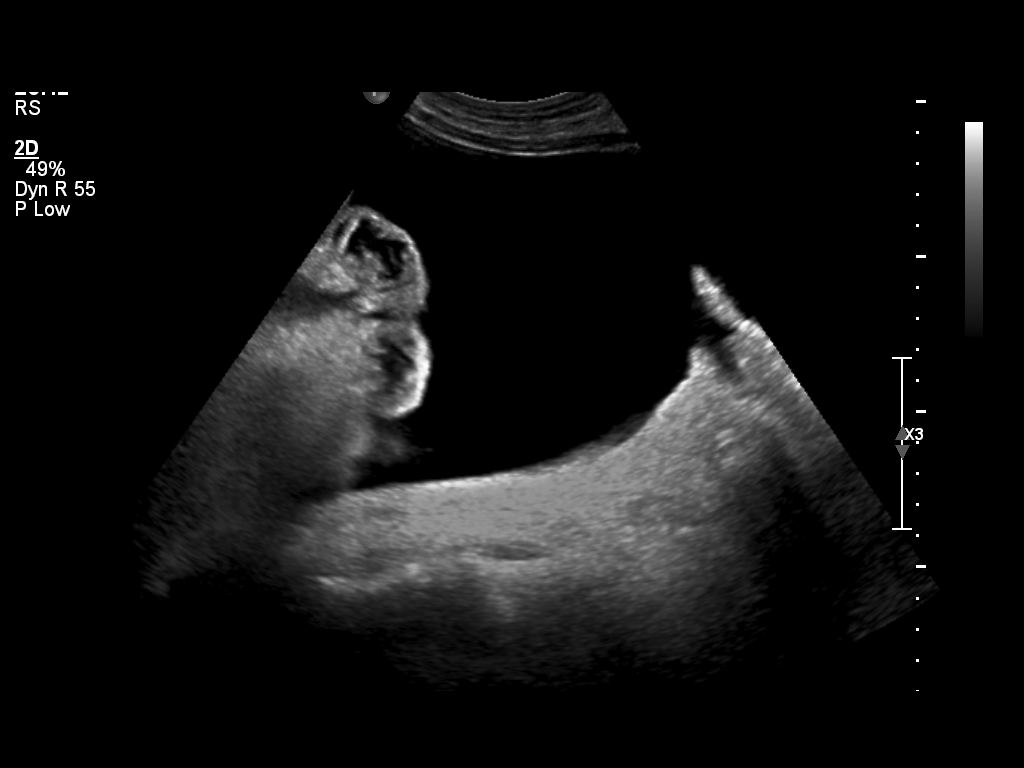
[im 4/4]
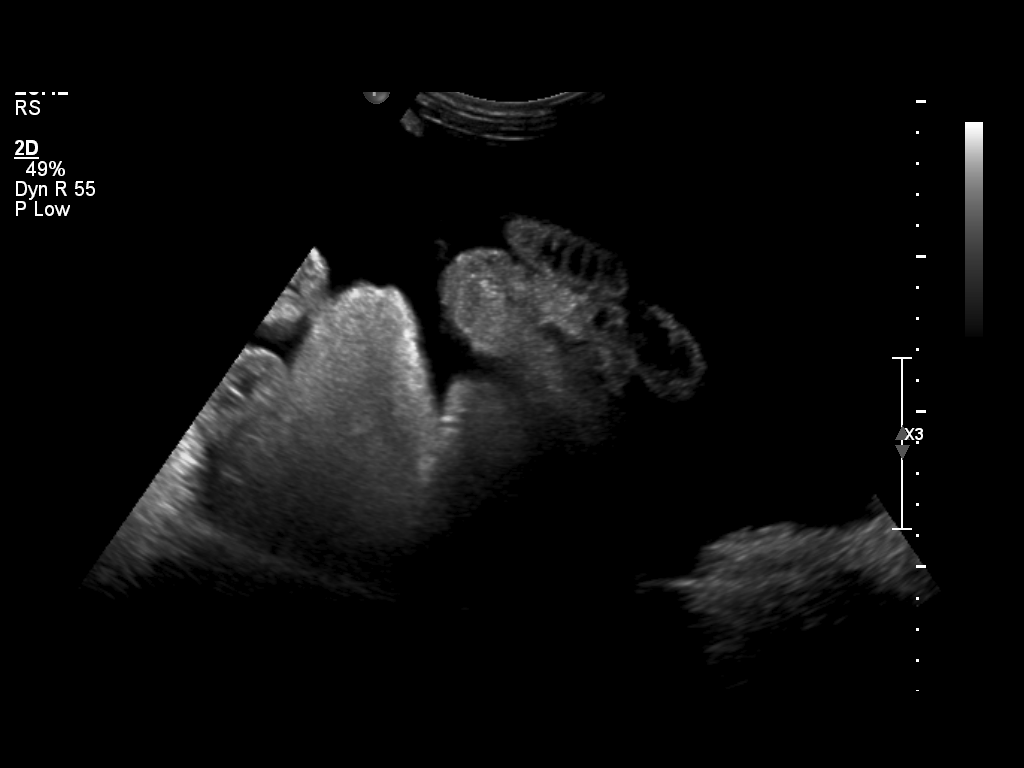

[4 of 4 positions shown; findings below may reference images not displayed]

FINDINGS

Procedure and risks were explained in detail to the patient. After sterile prep and drape, a large
pocket of ascites was identified the right lower quadrant and standard ultrasound-guided
paracentesis performed without difficulty. 13 liters of ascites was removed. Patient tolerated
procedure well. Albumin transfusion is pending.

IMPRESSION

Ultrasound-guided paracentesis as described.

Tech Notes:

ALCOHOLIC CIRRHOSIS OF THE LIVER WITH ASCITES

## 2019-07-05 ENCOUNTER — Encounter: Admit: 2019-07-05 | Discharge: 2019-07-05

## 2019-07-05 NOTE — Telephone Encounter
Left message for pt to call to schedule OLT eval udpate.

## 2019-07-21 ENCOUNTER — Encounter: Admit: 2019-07-21 | Discharge: 2019-07-21 | Payer: MEDICAID

## 2019-07-21 NOTE — Progress Notes
Pharmacy Liver Transplant Evaluation Update- Chart Review  I completed a chart review for [name] for their pharmacy transplant evaluation.  The medication list was updated and the patient?s current medication list is included below.     Home Medications    Medication Sig   furosemide (LASIX) 20 mg tablet Take one tablet by mouth daily as needed.   GLIMEPIRIDE PO Take  by mouth.   metFORMIN (GLUCOPHAGE) 1,000 mg tablet Take 1,000 mg by mouth twice daily with meals.   midodrine (PROAMATINE) 5 mg tablet TAKE 1 TABLET BY MOUTH THREE TIMES DAILY   ondansetron (ZOFRAN) 4 mg tablet TAKE 1 TABLET BY MOUTH EVERY 8 HOURS AS NEEDED FOR NAUSEA AND VOMITING   rifAXIMin (XIFAXAN) 550 mg tablet Take one tablet by mouth twice daily. Indications: impaired brain function due to liver disease   traZODone (DESYREL) 50 mg tablet TAKE 1 TO 2 TABLETS BY MOUTH ONCE DAILY AT BEDTIME AS NEEDED FOR SLEEP        Opioid/Benodiazepine Use: Per [facility], none  Prescription Insurance Coverage: [employer] RX  Antiplatelet/Anticoagulant Use: none    Assessment:  Potential Drug Interactions: none  Potential Barriers to Transplantation: none  Recommendation: Based on this chart review, there are no barriers to transplant    Sherlyn Hay, [facility]

## 2019-07-21 NOTE — Telephone Encounter
Notified by Orma Flaming, TFA, of need for updated clinicals and letter of medical necessity to be sent to [facility]. Documents sent to C. Fredric Mare for [employer].

## 2019-07-25 ENCOUNTER — Encounter: Admit: 2019-07-25 | Discharge: 2019-07-25 | Payer: MEDICAID

## 2019-07-25 NOTE — Telephone Encounter
Patient called in needs to reschedule appt and [place], [place] is [place] of [place] and will not be back in time.

## 2019-07-25 NOTE — Telephone Encounter
Attempted to reach Pt via telephone to confirm clinic appointment with Winifred Olive APRN on 07/28/19 at 1000. [place] 2nd floor BH 0900. NPO instructions given. Left detailed VM. Request call back if needing to reschedule.

## 2019-07-25 NOTE — Telephone Encounter
Called number on file and states invalid. Called patients spouse at [phone] and left VM. VM stating that I am inquiring whether patient has any other coverage than [employer] [place] MDCD. Patient lost MDCR on 07/05/19 and did state he may go back to work and be eligible for insurance through his employer. Confirming with patient in order to obtain correct Auth for Liver Transplant Listing. [employer]

## 2019-07-26 ENCOUNTER — Encounter: Admit: 2019-07-26 | Discharge: 2019-07-26 | Payer: MEDICAID

## 2019-07-26 NOTE — Telephone Encounter
Called and spoke to Conley at [facility]Rose stated to fax copy of UNOS Listing date from [employer] site and [facility] end date. Items were faxed by [employer] and confirmation received. Requested Listing Authorization as none on file due to [facility] being primary and not required. Patients MDCR termed 07/05/19

## 2019-07-28 ENCOUNTER — Encounter: Admit: 2019-07-28 | Discharge: 2019-07-28 | Payer: MEDICAID

## 2019-07-28 NOTE — Telephone Encounter
TRANSPLANT AUTHORIZATIONS Listing Auth     PLAN [employer] [place] MDCD  AUTHORIZATION Phase 2 Listing  Auth #:  [id number]  eff:  07/25/19 thru 07/24/20  NCM:  Hoyle Barr  Phone #:  [phone] Option 5 FAX #: [phone]  Additional:  None  Comments/request:  None

## 2019-07-31 ENCOUNTER — Encounter: Admit: 2019-07-31 | Discharge: 2019-07-31 | Payer: MEDICAID

## 2019-07-31 NOTE — Telephone Encounter
Left message for pt to call regarding waitlist labs.  Will need updated labs by 08/04.

## 2019-08-15 ENCOUNTER — Encounter: Admit: 2019-08-15 | Discharge: 2019-08-15 | Payer: MEDICAID

## 2019-08-15 NOTE — Telephone Encounter
LVM for pt requesting call back to coordinate labs/ reschedule appts.    Called wife. She states that pt's mother passed away and the funeral is on 8/12. Discussed that we can call back next week to coordinate labs and updating his appointments as they are not ready to schedule anything at this time. Will follow up next week.

## 2019-08-23 ENCOUNTER — Encounter: Admit: 2019-08-23 | Discharge: 2019-08-23 | Payer: MEDICAID

## 2019-08-23 NOTE — Telephone Encounter
LVM for wife following up on phone call from last week. Requested call back to discuss coordinating need for appointment and labs.

## 2019-08-31 ENCOUNTER — Encounter: Admit: 2019-08-31 | Discharge: 2019-08-31 | Payer: MEDICAID

## 2019-08-31 NOTE — Telephone Encounter
LVM requesting call back to plan for updated labs and rescheduling appt, [place] and eval update.

## 2019-09-12 ENCOUNTER — Encounter: Admit: 2019-09-12 | Discharge: 2019-09-12 | Payer: MEDICAID

## 2019-09-29 ENCOUNTER — Encounter: Admit: 2019-09-29 | Discharge: 2019-09-29 | Payer: MEDICAID

## 2019-10-10 ENCOUNTER — Encounter: Admit: 2019-10-10 | Discharge: 2019-10-10 | Payer: MEDICAID

## 2019-10-10 NOTE — Progress Notes
Per Dr. Constance Goltz and Delice Bison RN, patient made inactive on the transplant waitlist due to unable to contact.   Status updated in UNET to inactive.   Per Delice Bison, she will send patient letter re: status update.

## 2019-11-07 ENCOUNTER — Encounter: Admit: 2019-11-07 | Discharge: 2019-11-07 | Payer: MEDICAID

## 2019-11-07 NOTE — Telephone Encounter
attempted to call patient, patient lost to f/u since 07/31/2019. patient overdue for labs, imaging, and OLT evaluation update. Patient made INACTIVE on transplant list as of 10/10/2019, unable to contact.  Attempted to call patient again today to discuss COVID vaccine status per letter sent 10/19/2019, no answer, VM not available. Left VM for spouse to return call.   Will continue to monitor and discuss removal from list if patient is not able to be contacted in the near future.

## 2019-11-20 ENCOUNTER — Encounter: Admit: 2019-11-20 | Discharge: 2019-11-20 | Payer: MEDICAID

## 2019-11-20 NOTE — Telephone Encounter
attempted to call patient in f/u from previous letters and calls, no answer no VM available. Called spouse, Lurena Joiner, LVM to please return call for update.

## 2019-12-14 ENCOUNTER — Encounter: Admit: 2019-12-14 | Discharge: 2019-12-14 | Payer: MEDICAID

## 2019-12-14 DIAGNOSIS — K7031 Alcoholic cirrhosis of liver with ascites: Secondary | ICD-10-CM

## 2019-12-14 NOTE — Telephone Encounter
Received call from patient. Daughter has COVID and he needs to cancel appt for today. Rescheduled for December 28th along with [place] and labs. Patient notified.

## 2019-12-15 ENCOUNTER — Encounter: Admit: 2019-12-15 | Discharge: 2019-12-15 | Payer: MEDICAID

## 2019-12-15 NOTE — Telephone Encounter
12/15/19 verified active [employer] [place] MDCD via [employer] Reference Number: [id number]. Listing Auth expires 07/24/20

## 2019-12-20 ENCOUNTER — Encounter: Admit: 2019-12-20 | Discharge: 2019-12-20 | Payer: MEDICAID

## 2020-01-02 ENCOUNTER — Encounter: Admit: 2020-01-02 | Discharge: 2020-01-02 | Payer: MEDICAID

## 2020-01-02 NOTE — Telephone Encounter
LVM for patient to discuss missed appointments. Requested call back.

## 2020-01-09 ENCOUNTER — Encounter: Admit: 2020-01-09 | Discharge: 2020-01-09 | Payer: MEDICAID

## 2020-01-09 NOTE — Telephone Encounter
LVM requesting call back.

## 2020-01-16 NOTE — Telephone Encounter
Spoke with patient, confirmed appointment on 01/18/20 at 1 pm with Winifred Olive, APRN.

## 2020-01-18 ENCOUNTER — Encounter: Admit: 2020-01-18 | Discharge: 2020-01-18 | Payer: MEDICAID

## 2020-01-18 NOTE — Telephone Encounter
Pt called needing to cancel his appt on 01/13 at 100pm due to possibly having covid. Pt is wanting to reschedule. Please give him a call.

## 2020-02-14 IMAGING — US PARAWI
1 series · 4 of 4 positions shown · non-contrast
Comparison: none

[Series 1: us paracentesis abd w/image · 4 of 4 slices shown]
[im 1/4]
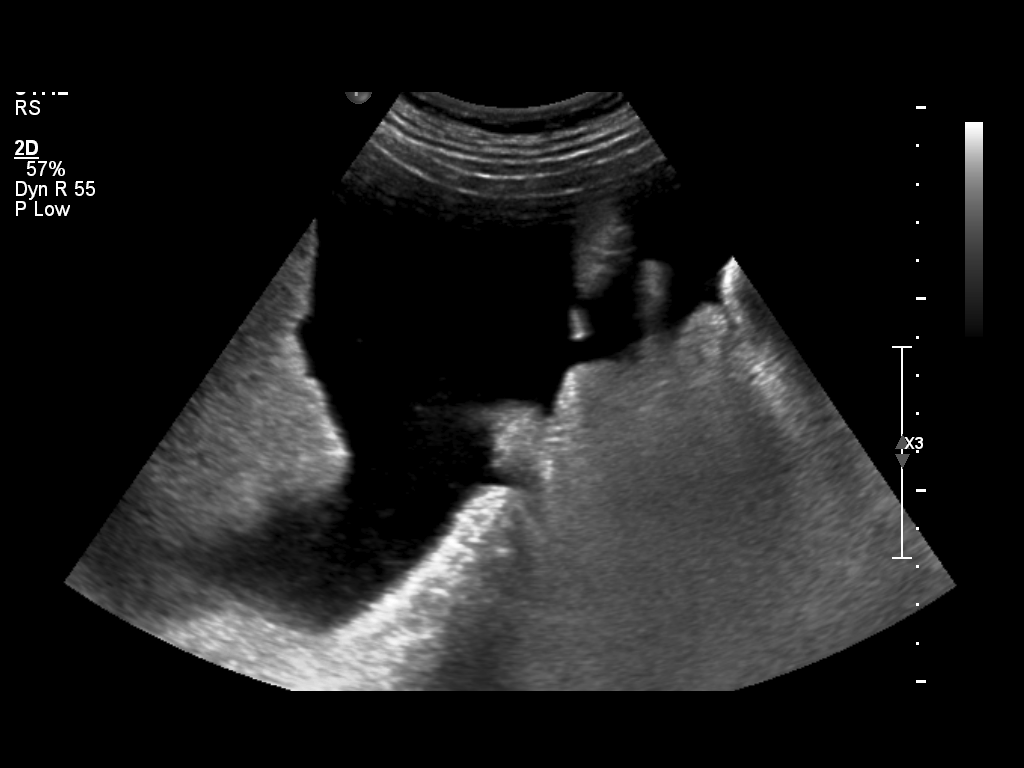
[im 2/4]
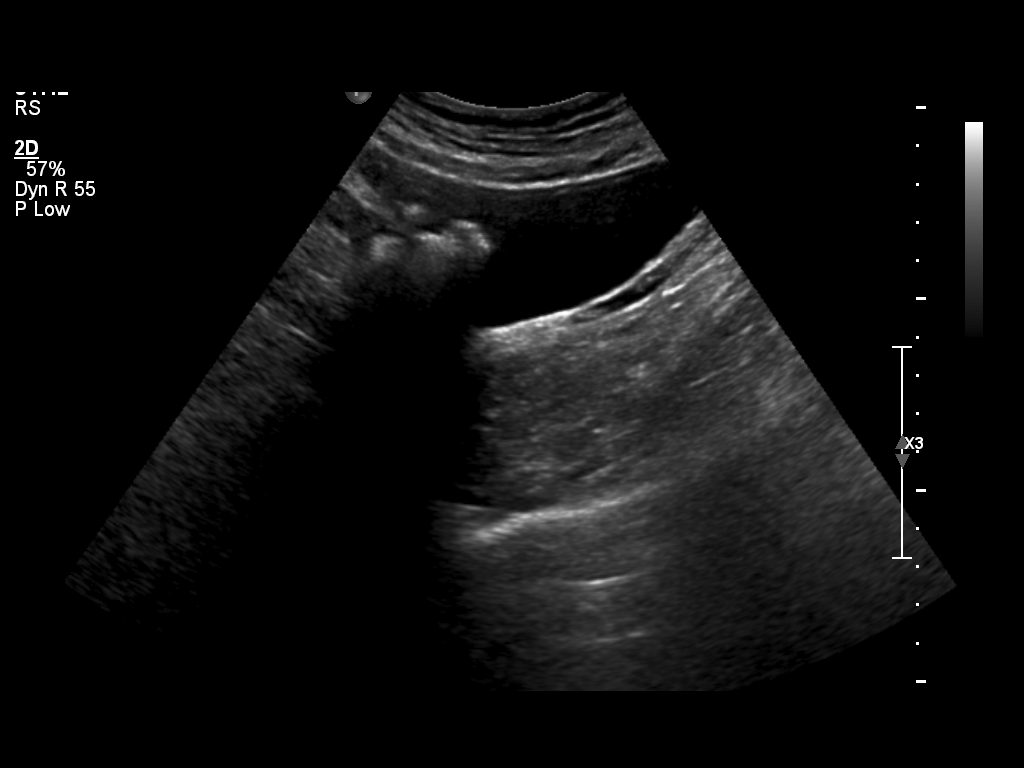
[im 3/4]
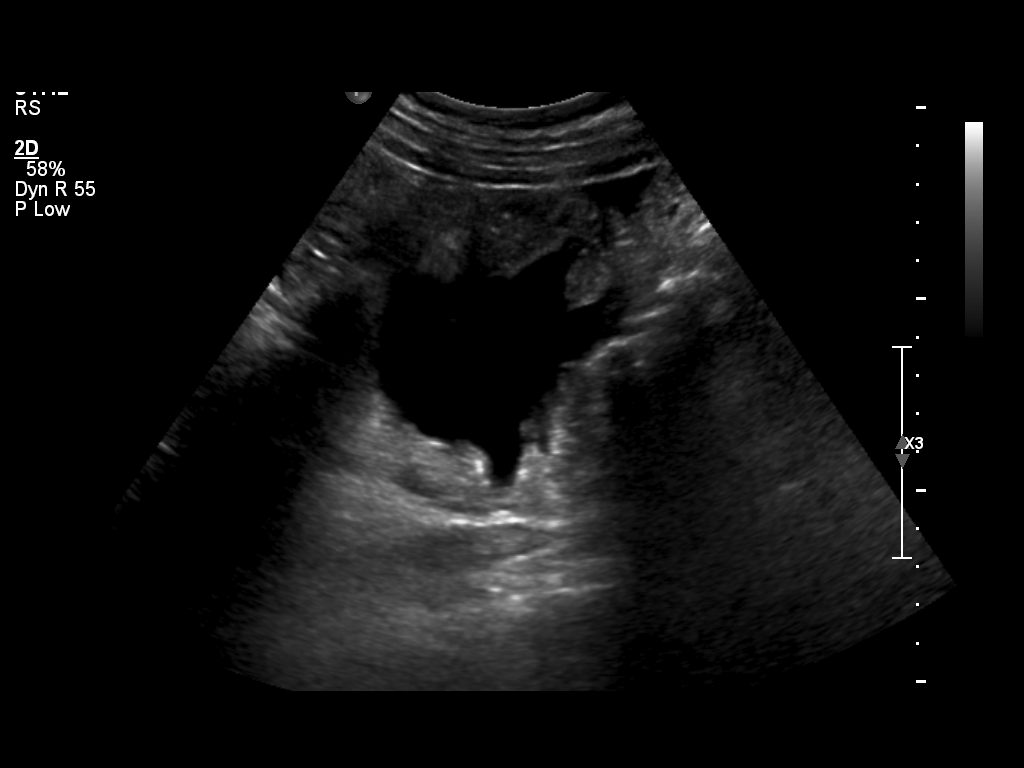
[im 4/4]
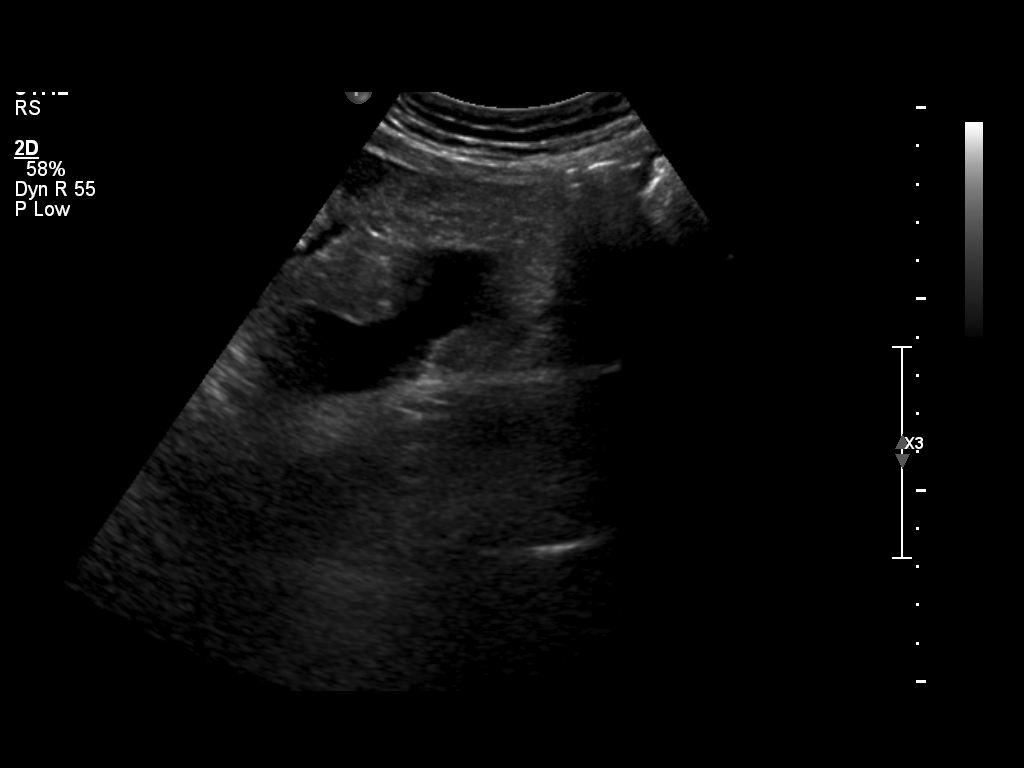

[4 of 4 positions shown; findings below may reference images not displayed]

DIAGNOSTIC STUDIES

EXAM
Ultrasound-guided paracentesis

INDICATION
Ascites

TECHNIQUE
Standard ultrasound-guided paracentesis was performed.

COMPARISONS
[DATE][REDACTED]

FINDINGS
Procedure and risks were explained in detail to the patient. After informed consent, patient was
prepped and draped in sterile fashion. Pocket of ascites was targeted using ultrasound guidance
within the right upper quadrant. Subsequently 2.5 liters of ascites was removed. Laboratory
evaluation is pending.

IMPRESSION
Ultrasound-guided paracentesis as described.

Tech Notes:

ascites

## 2020-02-15 NOTE — Telephone Encounter
LVM requesting call back to discuss rescheduling.

## 2020-03-08 ENCOUNTER — Encounter: Admit: 2020-03-08 | Discharge: 2020-03-08 | Payer: MEDICAID

## 2020-03-08 NOTE — Telephone Encounter
Notified by TFA that they were alerted from NCM that patient has been admitted to [facility] for depression and ETOH use. NCM provided contact at [facility] (Utilization Review RN) [phone].    LVM for Alvino Chapel that message was received and will plan to request records.

## 2020-03-13 ENCOUNTER — Encounter: Admit: 2020-03-13 | Discharge: 2020-03-13 | Payer: MEDICAID

## 2020-03-26 ENCOUNTER — Encounter: Admit: 2020-03-26 | Discharge: 2020-03-26 | Payer: MEDICAID

## 2020-03-26 NOTE — Progress Notes
Late entry:    Received return fax from [facility] that due to sensitive nature of records associated with recent admission, patient would need to sign release of information prior to faxing records.

## 2020-03-29 ENCOUNTER — Encounter: Admit: 2020-03-29 | Discharge: 2020-03-29 | Payer: MEDICAID

## 2020-03-29 NOTE — Telephone Encounter
LVM for patient requesting call back.

## 2020-04-18 ENCOUNTER — Encounter: Admit: 2020-04-18 | Discharge: 2020-04-18 | Payer: MEDICAID

## 2020-05-20 ENCOUNTER — Encounter: Admit: 2020-05-20 | Discharge: 2020-05-20 | Payer: MEDICAID

## 2020-05-20 NOTE — Telephone Encounter
LVM for patient requesting call back.     Called wife. She plans to have him call our office.

## 2020-06-24 ENCOUNTER — Encounter: Admit: 2020-06-24 | Discharge: 2020-06-24 | Payer: MEDICAID

## 2020-06-24 NOTE — Telephone Encounter
LVM for patient requesting call back explaining that Dr. Constance Goltz would like to see patient in clinic.    Patient remains status 7 at this time due to non compliance.

## 2020-07-18 ENCOUNTER — Encounter: Admit: 2020-07-18 | Discharge: 2020-07-18 | Payer: MEDICAID

## 2020-07-29 ENCOUNTER — Encounter: Admit: 2020-07-29 | Discharge: 2020-07-29 | Payer: MEDICAID

## 2020-07-29 NOTE — Telephone Encounter
TRANSPLANT AUTHORIZATIONS Listing Auth Extension     PLAN [employer]um/[employer] [place] MDCD  AUTHORIZATION Phase 2 Listing  Auth #:  [id number]      eff:  07/25/19 thru 10/24/20  NCM:  Hoyle Barr          Phone #:  [phone] Option 5            FAX #: [phone]  Additional:  None  Comments/request: Received TC from NCM [place] on 07/29/20 extending Auth 3 months

## 2020-07-30 ENCOUNTER — Encounter: Admit: 2020-07-30 | Discharge: 2020-07-30 | Payer: MEDICAID

## 2020-07-30 NOTE — Telephone Encounter
LVM for patient requesting call back.    LVM for spouse requesting to have patient call our office.

## 2020-07-31 ENCOUNTER — Encounter: Admit: 2020-07-31 | Discharge: 2020-07-31 | Payer: MEDICAID

## 2020-07-31 NOTE — Progress Notes
Certified letter mailed to pt regarding liver transplant committee review decision. Receipt number [id number]. Receipt attached below.

## 2020-07-31 NOTE — Telephone Encounter
Patient reviewed at liver transplant committee. Due to noncompliance and losing contact with our center, decision made to remove patient from waitlist.  Outside records indicate patient is using alcohol. Will send certified letter. Message left yesterday requesting call back. Patient removed from [facility]. EOC and waitlist manager updated. Will send letter via certified mail.

## 2020-08-01 ENCOUNTER — Encounter: Admit: 2020-08-01 | Discharge: 2020-08-01 | Payer: MEDICAID

## 2020-08-15 ENCOUNTER — Encounter: Admit: 2020-08-15 | Discharge: 2020-08-15 | Payer: MEDICAID

## 2020-08-15 NOTE — Progress Notes
Received returned certified letter sent on 07/31/20. Per return letter pt no longer lives at the listed address. Per recent [facility] visit pt address is listed as [address] [place], [place] [address]. Attempted to contact pt to verify new address. No answer, left voice message. Contact information provided. Certified Letter mailed to the above address.

## 2020-10-07 ENCOUNTER — Encounter: Admit: 2020-10-07 | Discharge: 2020-10-07 | Payer: MEDICAID

## 2020-10-07 NOTE — Telephone Encounter
Per notes patient was removed from [facility] on 07/31/20. Termed coverage on Transplant Guarantor as of 07/31/20

## 2021-02-24 ENCOUNTER — Encounter: Admit: 2021-02-24 | Discharge: 2021-02-24 | Payer: MEDICAID

## 2021-02-24 NOTE — Telephone Encounter
Pt called in to schedule an appointment . Call back was requested

## 2021-02-26 ENCOUNTER — Encounter: Admit: 2021-02-26 | Discharge: 2021-02-26 | Payer: MEDICAID

## 2021-02-28 ENCOUNTER — Ambulatory Visit: Admit: 2021-02-28 | Discharge: 2021-03-01 | Payer: MEDICAID

## 2021-02-28 ENCOUNTER — Encounter: Admit: 2021-02-28 | Discharge: 2021-02-28 | Payer: MEDICAID

## 2021-02-28 DIAGNOSIS — F1020 Alcohol dependence, uncomplicated: Secondary | ICD-10-CM

## 2021-02-28 DIAGNOSIS — K7031 Alcoholic cirrhosis of liver with ascites: Secondary | ICD-10-CM

## 2021-02-28 DIAGNOSIS — K7460 Unspecified cirrhosis of liver: Secondary | ICD-10-CM

## 2021-03-01 DIAGNOSIS — K769 Liver disease, unspecified: Secondary | ICD-10-CM

## 2021-05-27 ENCOUNTER — Encounter: Admit: 2021-05-27 | Discharge: 2021-05-27 | Payer: MEDICAID

## 2021-08-11 ENCOUNTER — Encounter: Admit: 2021-08-11 | Discharge: 2021-08-11 | Payer: MEDICAID

## 2021-08-13 ENCOUNTER — Encounter: Admit: 2021-08-13 | Discharge: 2021-08-13 | Payer: MEDICAID

## 2021-08-15 ENCOUNTER — Ambulatory Visit: Admit: 2021-08-15 | Discharge: 2021-08-15 | Payer: Private Health Insurance - Indemnity

## 2021-08-15 ENCOUNTER — Encounter: Admit: 2021-08-15 | Discharge: 2021-08-15 | Payer: Private Health Insurance - Indemnity

## 2021-08-15 ENCOUNTER — Ambulatory Visit: Admit: 2021-08-15 | Discharge: 2021-08-16 | Payer: Private Health Insurance - Indemnity

## 2021-08-15 VITALS — BP 138/89 | HR 106 | Temp 97.80000°F | Ht 75.0 in | Wt 249.6 lb

## 2021-08-15 DIAGNOSIS — K7460 Unspecified cirrhosis of liver: Secondary | ICD-10-CM

## 2021-08-15 DIAGNOSIS — K7031 Alcoholic cirrhosis of liver with ascites: Secondary | ICD-10-CM

## 2021-08-15 DIAGNOSIS — K769 Liver disease, unspecified: Secondary | ICD-10-CM

## 2021-08-15 DIAGNOSIS — F1020 Alcohol dependence, uncomplicated: Secondary | ICD-10-CM

## 2021-08-15 NOTE — Progress Notes
Date of Service: 08/15/2021    Tristan Briggs is a 41 y.o. male.    Subjective:             Chief Complaint/Purpose of Visit: routine follow up for patient with cirrhosis secondary to alcohol misuse     History of Present Illness    TristanBriggs is a 41 year old male who is followed in our clinic for management of cirrhotic stage liver disease secondary to history of alcohol misuse.  He was previously evaluated for transplant, but ultimately, declined for listing as he did not maintain follow-up in our clinic.  He saw Dr. Constance Goltz in 2021.  He then reestablished with me earlier this year.  He presents today for routine follow-up.    At our last visit, Tristan Briggs acknowledges a significant relapse with alcohol.  Today, he reports he is still struggling with alcohol.  Since our visit in February, he has been to inpatient rehab on 2 different occasions.  He has been 30 days a center in [place], relapsed after returning home.  He then did 30 days at [facility] in [place], and has returned to drinking after that stay.  Following both admissions, he did not engage in any outpatient treatment.  He acknowledges he needs to in order to be successful.  He was found to center in [place] that he wants to establish with.  He would also like to get involved in AA.    He presented to [facility]. Tristan Briggs's earlier this week with concerns for withdrawal.  He did not warrant admission to the hospital.  He did lab labs completed, located in care everywhere, similar to trends earlier this year.    He reports some occasional brain fog/forgetfulness, but no significant issues with hepatic encephalopathy.  He has lactulose at home to be used as needed.  No recent issues with ascites or lower extremity edema.    We referred him for EGD following last visit, they were unable to reach patient to schedule.    Tristan Briggs is accompanied today by significant other.    Tristan Briggs endorses no other worrisome health concerns at today's visit.       Review of Systems: A 10 point review systems is performed and with the exceptions noted in the HPI are otherwise unremarkable.     Objective:         Medication Sig   ? cyclobenzaprine (FLEXERIL) 10 mg tablet Take one tablet by mouth at bedtime as needed for Muscle Cramps (prn).   ? lactulose 10 gram/15 mL oral solution Take 15 mL by mouth three times daily.   ? lisinopriL (ZESTRIL) 20 mg tablet Take one tablet by mouth daily.   ? metFORMIN (GLUCOPHAGE) 500 mg tablet Take one tablet by mouth twice daily.   ? mirtazapine (REMERON) 15 mg tablet Take one tablet by mouth at bedtime daily. prn     Vitals:    08/15/21 1321   BP: 138/89   BP Source: Arm, Left Upper   Pulse: 106   Temp: 36.6 ?C (97.8 ?F)   SpO2: 100%   TempSrc: Oral   PainSc: Zero   Weight: 113.2 kg (249 lb 9.6 oz)   Height: 190.5 cm (6' 3)     Body mass index is 31.2 kg/m?Marland Kitchen    Physical Exam   Constitutional: He appears well-nourished. No distress.   Eyes: faint scleral icterus.   Cardiovascular: Normal rate, regular rhythm and normal heart sounds.    Pulmonary/Chest:  Effort normal and breath sounds normal.   Abdominal: Soft. He exhibits no distension. There is no tenderness.   Musculoskeletal: Normal range of motion. He exhibits no edema.   Neurological: He is alert and oriented to person, place, and time.   Skin: Skin is warm and dry.   No jaundice, no spider angiomas         Assessment and Plan:  1. Cirrhosis secondary to alcohol misuse: Labs from earlier this week at [facility]. Tristan Briggs's reviewed in care everywhere-- MELD score is 13 (12 at our last visit) Liver enzymes elevated, consistent with excess alcohol use-- patient had a grossly positive blood alcohol level with admissin.  There is no evidence of ascites or hepatic encephalopathy on exam today. I strongly recommend that patient obtain treatment for his alcohol misuse disorder-- he plans to establish with an outpatient program, which seems appropriate given what has occurred since last visit Ongoing alcohol use will certainly increase his risk of disease progression and death from this disease. Will continue to support him the best we can.   ?  Education provided on s/s of HE and when to initiate lactulose-- we advise against monitoring serum ammonia levels   ?  2. Screening for hepatocellular carcinoma:  Up-to-date.  Ultrasound completed prior to today's visit and demonstrated no worrisome liver lesions.  Repeat due in 6 months time.  ?  3. Screening for esophageal varices: Case request placed following last visit, however, GI unable to reach patient.  We have provided him the scheduling phone number today so he can call and schedule his convenience.     ?    At the end of the visit, Tristan Briggs had all of his questions answered. He has our contact information and is encouraged to call with any new questions or concerns.  I would like to see the patient back for follow up in 6 months

## 2021-08-16 DIAGNOSIS — K7030 Alcoholic cirrhosis of liver without ascites: Secondary | ICD-10-CM

## 2021-08-27 ENCOUNTER — Ambulatory Visit: Admit: 2021-08-27 | Discharge: 2021-08-27 | Payer: Private Health Insurance - Indemnity

## 2021-08-27 DIAGNOSIS — K7460 Unspecified cirrhosis of liver: Secondary | ICD-10-CM

## 2021-08-27 DIAGNOSIS — K769 Liver disease, unspecified: Secondary | ICD-10-CM

## 2021-08-27 DIAGNOSIS — K7030 Alcoholic cirrhosis of liver without ascites: Secondary | ICD-10-CM

## 2021-08-27 NOTE — Progress Notes
You are scheduled for a EGD on 08/28/21 with Dr. Domenica Reamer must ARRIVE AND CHECK IN at Admissions by 1:10pm    Your procedure is scheduled to start at: 2:40pm    Check in to Admissions at:  [facility]  [address]  [place], [place]., [address]       If you need to reschedule or if you have questions call [phone] Option #3.    Procedure questions after 4:45pm, the day before your procedure- All questions about prep and procedure should be answered by the GI Fellow on call. To page the GI Fellow, call the main hospital number at: [phone] and ask the operator to page the GI Fellow on call.    EGD (ESOPHAGOGASTRODUODENOSCOPY) PREP      Upper GI endoscopy allows healthcare providers to look directly into the beginning of your gastrointestinal(GI) tract.  The esophagus, stomach, and duodenum (first part of the small intestine) make up the upper GI tract.      1 Week Prior:  1. If you are taking a weekly GLP-1 agonist (ex. Ozempic, Mounjaro)  do not take this medication the week prior.     5 Days Prior:  1. Check with your prescribing physician for instructions about stopping your blood thinner.  Examples of blood thinners are Aleve, Aspirin. Coumadin, Eliquis, Ibuprofen, Naproxen, Plavix, and Xarelto.  2. Do not give yourself a Lovenox injection the morning of the test. Lovenox injections may be taken as usual through the day before your test.    Day of Exam:  1. Do not eat or drink anything after midnight the night before your exam. However, if your exam is in the afternoon you may drink clear liquids only up until (4) hours before your scheduled procedure time.  After this, you should have nothing by mouth.  This includes GUM or CANDY.   a. Chewing tobacco must be stopped  (6) hours before your scheduled procedure.   b. If you have an early morning test, take ONLY your essential morning medications (heart, blood pressure, seizure, etc.) with a small sip of water.   c. You will be sedated for the procedure. A responsible adult must drive you home (no Benedetto Goad, taxis, or buses are permitted). If you do not have a driver we will be unable to do the test.   d. If you are taking a daily GLP-1 agonist (ex. Ozempic, Mounjaro)  do not take this medication the day of the test.   e. You will be here for (3-4) hours from arrival time.   f. You will not be able to return to work the same day.   g. Please bring a list of your current medications and the dosages with you.     The Procedure:  ? You will lie on the endoscopy table. Usually patients lie on the left side.  ? You will be monitored and given oxygen.   ? You are given sedation (relaxing) medication through an intravenous (IV) line.  ? The healthcare provider will put the endoscope in your mouth and down your esophagus. It is thinner than most pieces of food that you swallow.  It will not affect your breathing. The medicine helps keep you from gagging.   ? Air is inserted to expand your GI tract. It can make you burp.  ? During the procedure, the healthcare provider can take biopsies (tissue samples), remove abnormalities such as polyps, or treat abnormalities though  a variety of devices placed through the endoscope. You will not feel this.   ? The endoscope carries images of your upper GI tract to a video screen.  ? An adult must drive you home.

## 2021-08-28 ENCOUNTER — Ambulatory Visit: Admit: 2021-08-28 | Discharge: 2021-08-28 | Payer: Private Health Insurance - Indemnity

## 2021-08-28 ENCOUNTER — Encounter: Admit: 2021-08-28 | Discharge: 2021-08-28 | Payer: Private Health Insurance - Indemnity

## 2021-08-28 DIAGNOSIS — F1020 Alcohol dependence, uncomplicated: Secondary | ICD-10-CM

## 2021-08-28 DIAGNOSIS — K7460 Unspecified cirrhosis of liver: Secondary | ICD-10-CM

## 2021-08-28 MED ORDER — PROPOFOL 10 MG/ML IV EMUL 20 ML (INFUSION)(AM)(OR)
INTRAVENOUS | 0 refills | Status: DC
Start: 2021-08-28 — End: 2021-08-28

## 2021-08-28 MED ORDER — FENTANYL CITRATE (PF) 50 MCG/ML IJ SOLN
INTRAVENOUS | 0 refills | Status: DC
Start: 2021-08-28 — End: 2021-08-28

## 2021-08-28 MED ORDER — LIDOCAINE (PF) 20 MG/ML (2 %) IJ SOLN
INTRAVENOUS | 0 refills | Status: DC
Start: 2021-08-28 — End: 2021-08-28

## 2021-08-28 MED ORDER — PROPOFOL INJ 10 MG/ML IV VIAL
INTRAVENOUS | 0 refills | Status: DC
Start: 2021-08-28 — End: 2021-08-28

## 2021-08-28 MED ADMIN — LACTATED RINGERS IV SOLP [4318]: 1000.000 mL | INTRAVENOUS | @ 20:00:00 | Stop: 2021-08-28 | NDC 00338011704

## 2021-08-28 NOTE — Anesthesia Post-Procedure Evaluation
Post-Anesthesia Evaluation    Name: Tristan Briggs      MRN: [id number]     DOB: [birth year]     Age: 41 y.o.     Sex: male   __________________________________________________________________________     Procedure Information     Anesthesia Start Date/Time: 08/28/21 1447    Procedure: ESOPHAGOGASTRODUODENOSCOPY WITH BAND LIGATION ESOPHAGEAL/ GASTRIC VARICES - FLEXIBLE    Location: ENDO 5 / ENDO/GI    Surgeons: Veneta Penton, MD          Post-Anesthesia Vitals  BP: 370/96 (08/24 1535)  Temp: 36.8 C (98.3 F) (08/24 1505)  Pulse: 72 (08/24 1535)  Respirations: 20 PER MINUTE (08/24 1535)  SpO2: 94 % (08/24 1535)  SpO2 Pulse: 72 (08/24 1535)  O2 Device: None (Room air) (08/24 1535)   Vitals Value Taken Time   BP 115/82 08/28/21 1535   Temp 36.8 C (98.3 F) 08/28/21 1505   Pulse 72 08/28/21 1535   Respirations 20 PER MINUTE 08/28/21 1535   SpO2 94 % 08/28/21 1535   O2 Device None (Room air) 08/28/21 1535   ABP     ART BP           Post Anesthesia Evaluation Note    Evaluation location: Pre/Post  Patient participation: recovered; patient participated in evaluation  Level of consciousness: alert  Pain management: adequate    Hydration: normovolemia  Temperature: 36.0C - 38.4C  Airway patency: adequate    Perioperative Events       Post-op nausea and vomiting: no PONV    Postoperative Status  Cardiovascular status: hemodynamically stable  Respiratory status: spontaneous ventilation  Follow-up needed: none        Perioperative Events  There were no known notable events for this encounter.

## 2021-08-28 NOTE — Anesthesia Pre-Procedure Evaluation
Anesthesia Pre-Procedure Evaluation    Name: Tristan Briggs      MRN: [id number]     DOB: 04-27-1980     Age: 41 y.o.     Sex: male   _________________________________________________________________________     Procedure Info:   Procedure Information     Date/Time: 08/28/21 1440    Procedure: ESOPHAGOGASTRODUODENOSCOPY WITH SPECIMEN COLLECTION BY BRUSHING/ WASHING    Location: ENDO 5 / ENDO/GI    Surgeons: Veneta Penton, MD          Physical Assessment  Vital Signs (last filed in past 24 hours):  BP: 118/87 (08/24 1354)  Temp: 36.7 ?C (98.1 ?F) (08/24 1354)  Pulse: 79 (08/24 1354)  Respirations: 15 PER MINUTE (08/24 1354)  SpO2: 97 % (08/24 1354)  Height: 190.5 cm (6' 3) (08/24 1354)  Weight: 117.9 kg (260 lb) (08/24 1354)  SpO2 Pulse: 79 (08/24 1354)      Patient History   No Known Allergies     Current Medications    Medication Directions   cyclobenzaprine (FLEXERIL) 10 mg tablet Take one tablet by mouth at bedtime as needed for Muscle Cramps (prn).   lactulose 10 gram/15 mL oral solution Take 15 mL by mouth three times daily.   lisinopriL (ZESTRIL) 20 mg tablet Take one tablet by mouth daily.   metFORMIN (GLUCOPHAGE) 500 mg tablet Take one tablet by mouth twice daily.   mirtazapine (REMERON) 15 mg tablet Take one tablet by mouth at bedtime daily. prn       Review of Systems/Medical History      Patient summary reviewed  Nursing notes reviewed  Pertinent labs reviewed    PONV Screening: Non-smoker  No history of anesthetic complications  No family history of anesthetic complications      Airway - negative        Pulmonary      Not a current smoker        No recent URI      No Obstructive Sleep Apnea      Cardiovascular         Exercise tolerance: >4 METS      Beta Blocker therapy: No      Beta blockers within 24 hours: n/a      Hypertension, well controlled              GI/Hepatic/Renal           Liver disease: cirrhosis and esophageal varices          No nausea      No vomiting        Neuro/Psych - negative        Musculoskeletal - negative          Endocrine/Other       Diabetes, well controlled, type 2        Anemia        Constitution - negative   Physical Exam    Airway Findings      Mallampati: II      TM distance: >3 FB      Neck ROM: full      Mouth opening: good      Airway patency: adequate    Dental Findings:       Upper dentures          Cardiovascular Findings:       Rhythm: regular      Rate: normal    Pulmonary Findings:       Breath  sounds clear to auscultation.    Neurological Findings:       Alert and oriented x 3    Constitutional findings:       No acute distress      Well-developed      Well-nourished       Diagnostic Tests  Hematology:   Lab Results   Component Value Date    HGB 17.3 05/10/2019    HCT 53.0 05/10/2019    PLTCT 166 05/10/2019    WBC 6.4 05/10/2019    NEUT 67.2 05/10/2019    ANC 4.3 05/10/2019    LYMPH 19.1 05/10/2019    ALC 1.2 05/10/2019    MONA 7 02/02/2019    AMC 0.5 05/10/2019    EOSA 5 02/02/2019    ABC 0.1 05/10/2019    BASOPHILS 1.9 05/10/2019    MCV 97.0 05/10/2019    MCH 31.6 05/10/2019    MCHC 32.6 05/10/2019    MPV 9.1 02/02/2019    RDW 12.8 05/10/2019         General Chemistry:   Lab Results   Component Value Date    NA 141 05/10/2019    K 4.4 05/10/2019    CL 106 05/10/2019    CO2 26 02/02/2019    GAP 10 02/02/2019    BUN 29 02/02/2019    CR 1.37 02/02/2019    GLU 165 05/10/2019    CA 10.3 05/10/2019    ALBUMIN 4.8 02/02/2019    LACTIC 1.5 01/01/2017    MG 1.7 03/15/2017    TOTBILI 0.9 02/02/2019    PO4 3.9 03/15/2017      Coagulation:   Lab Results   Component Value Date    PT 12.0 05/10/2019    PTT 34.0 03/15/2017    INR 1.1 05/10/2019       PAC Plan    Anesthesia Plan    ASA score: 3   Plan: MAC and general  Induction method: intravenous      Informed Consent  Anesthetic plan and risks discussed with patient.  Use of blood products discussed with patient  Blood Consent: consented      Plan discussed with: anesthesiologist and CRNA.      Alerts

## 2021-08-29 ENCOUNTER — Ambulatory Visit: Admit: 2021-08-29 | Discharge: 2021-08-29 | Payer: Private Health Insurance - Indemnity

## 2021-08-29 ENCOUNTER — Encounter: Admit: 2021-08-29 | Discharge: 2021-08-29 | Payer: Private Health Insurance - Indemnity

## 2021-08-29 DIAGNOSIS — F1020 Alcohol dependence, uncomplicated: Secondary | ICD-10-CM

## 2021-08-29 DIAGNOSIS — K7031 Alcoholic cirrhosis of liver with ascites: Secondary | ICD-10-CM

## 2021-08-29 DIAGNOSIS — K7460 Unspecified cirrhosis of liver: Secondary | ICD-10-CM

## 2021-09-01 ENCOUNTER — Encounter: Admit: 2021-09-01 | Discharge: 2021-09-01 | Payer: Private Health Insurance - Indemnity

## 2021-09-29 ENCOUNTER — Encounter: Admit: 2021-09-29 | Discharge: 2021-09-29 | Payer: Private Health Insurance - Indemnity

## 2021-11-05 ENCOUNTER — Encounter: Admit: 2021-11-05 | Discharge: 2021-11-05 | Payer: Private Health Insurance - Indemnity

## 2021-11-21 ENCOUNTER — Encounter: Admit: 2021-11-21 | Discharge: 2021-11-21 | Payer: Private Health Insurance - Indemnity

## 2021-11-30 ENCOUNTER — Encounter: Admit: 2021-11-30 | Discharge: 2021-11-30 | Payer: Private Health Insurance - Indemnity

## 2021-12-01 ENCOUNTER — Encounter: Admit: 2021-12-01 | Discharge: 2021-12-01 | Payer: Private Health Insurance - Indemnity

## 2022-02-20 ENCOUNTER — Encounter: Admit: 2022-02-20 | Discharge: 2022-02-20 | Payer: Private Health Insurance - Indemnity

## 2022-03-03 ENCOUNTER — Encounter: Admit: 2022-03-03 | Discharge: 2022-03-03

## 2022-04-02 ENCOUNTER — Encounter: Admit: 2022-04-02 | Discharge: 2022-04-02

## 2022-04-06 ENCOUNTER — Encounter: Admit: 2022-04-06 | Discharge: 2022-04-06

## 2022-04-06 NOTE — Telephone Encounter
LVM for the patient to return call to R/S missed appointment.

## 2022-05-05 ENCOUNTER — Encounter: Admit: 2022-05-05 | Discharge: 2022-05-05

## 2022-06-29 ENCOUNTER — Encounter: Admit: 2022-06-29 | Discharge: 2022-06-29

## 2022-08-11 ENCOUNTER — Encounter: Admit: 2022-08-11 | Discharge: 2022-08-11

## 2022-08-18 ENCOUNTER — Encounter: Admit: 2022-08-18 | Discharge: 2022-08-18

## 2022-08-18 NOTE — Telephone Encounter
WQ appointment request    Creation Date: 08/15/2021    Appt Req Date: 02/15/22    Appt Scheduled Date: 02/27/22    Patient was a no show patient contacted 3x in April and unable to contact' letter sent. Please try to contact patient again and send warning' Letter

## 2022-08-19 ENCOUNTER — Encounter: Admit: 2022-08-19 | Discharge: 2022-08-19

## 2022-08-19 NOTE — Telephone Encounter
Called [facility] to reschedule cancelled appointment with Winifred Olive, voicemail is full, could not leave a message.    Sent letter

## 2022-09-29 ENCOUNTER — Encounter: Admit: 2022-09-29 | Discharge: 2022-09-29

## 2022-09-29 NOTE — Telephone Encounter
Called Jahmi, unable to leave a message mailbox is full     Called EC/ Spouse - Lurena Joiner, left voicemail requesting she give a message to patient to call to schedule an appointment with Dr Irish Elders or Winifred Olive.     Sent certified final warning letter

## 2022-11-17 ENCOUNTER — Encounter: Admit: 2022-11-17 | Discharge: 2022-11-17

## 2023-09-29 ENCOUNTER — Encounter: Admit: 2023-09-29 | Discharge: 2023-09-29
